# Patient Record
Sex: Female | Born: 1945 | Race: White | Hispanic: No | Marital: Married | Smoking: Former smoker
Health system: Southern US, Community
[De-identification: ages and names within clinical notes are randomized; demographics above are authoritative.]

## PROBLEM LIST (undated history)

## (undated) DIAGNOSIS — M1990 Unspecified osteoarthritis, unspecified site: Secondary | ICD-10-CM

## (undated) DIAGNOSIS — R519 Headache, unspecified: Secondary | ICD-10-CM

## (undated) DIAGNOSIS — Z809 Family history of malignant neoplasm, unspecified: Secondary | ICD-10-CM

## (undated) DIAGNOSIS — I10 Essential (primary) hypertension: Secondary | ICD-10-CM

## (undated) DIAGNOSIS — Z800 Family history of malignant neoplasm of digestive organs: Secondary | ICD-10-CM

## (undated) DIAGNOSIS — J45909 Unspecified asthma, uncomplicated: Secondary | ICD-10-CM

## (undated) DIAGNOSIS — N938 Other specified abnormal uterine and vaginal bleeding: Secondary | ICD-10-CM

## (undated) DIAGNOSIS — Z8049 Family history of malignant neoplasm of other genital organs: Secondary | ICD-10-CM

## (undated) DIAGNOSIS — K219 Gastro-esophageal reflux disease without esophagitis: Secondary | ICD-10-CM

## (undated) DIAGNOSIS — Z8041 Family history of malignant neoplasm of ovary: Secondary | ICD-10-CM

## (undated) DIAGNOSIS — M8580 Other specified disorders of bone density and structure, unspecified site: Secondary | ICD-10-CM

## (undated) DIAGNOSIS — E079 Disorder of thyroid, unspecified: Secondary | ICD-10-CM

## (undated) DIAGNOSIS — Z8489 Family history of other specified conditions: Secondary | ICD-10-CM

## (undated) DIAGNOSIS — D6800 Von Willebrand disease, unspecified: Secondary | ICD-10-CM

## (undated) DIAGNOSIS — Z8042 Family history of malignant neoplasm of prostate: Secondary | ICD-10-CM

## (undated) DIAGNOSIS — R112 Nausea with vomiting, unspecified: Secondary | ICD-10-CM

## (undated) DIAGNOSIS — E7800 Pure hypercholesterolemia, unspecified: Secondary | ICD-10-CM

## (undated) DIAGNOSIS — D219 Benign neoplasm of connective and other soft tissue, unspecified: Secondary | ICD-10-CM

## (undated) DIAGNOSIS — Z9889 Other specified postprocedural states: Secondary | ICD-10-CM

## (undated) HISTORY — DX: Other specified disorders of bone density and structure, unspecified site: M85.80

## (undated) HISTORY — DX: Disorder of thyroid, unspecified: E07.9

## (undated) HISTORY — DX: Family history of malignant neoplasm, unspecified: Z80.9

## (undated) HISTORY — PX: TUBAL LIGATION: SHX77

## (undated) HISTORY — DX: Essential (primary) hypertension: I10

## (undated) HISTORY — DX: Pure hypercholesterolemia, unspecified: E78.00

## (undated) HISTORY — DX: Family history of malignant neoplasm of ovary: Z80.41

## (undated) HISTORY — DX: Benign neoplasm of connective and other soft tissue, unspecified: D21.9

## (undated) HISTORY — DX: Family history of malignant neoplasm of prostate: Z80.42

## (undated) HISTORY — DX: Other specified abnormal uterine and vaginal bleeding: N93.8

## (undated) HISTORY — DX: Family history of malignant neoplasm of other genital organs: Z80.49

## (undated) HISTORY — DX: Family history of malignant neoplasm of digestive organs: Z80.0

## (undated) HISTORY — PX: OTHER SURGICAL HISTORY: SHX169

## (undated) HISTORY — DX: Gastro-esophageal reflux disease without esophagitis: K21.9

## (undated) HISTORY — PX: MOUTH SURGERY: SHX715

---

## 1996-09-04 HISTORY — PX: OOPHORECTOMY: SHX86

## 1996-09-04 HISTORY — PX: VAGINAL HYSTERECTOMY: SUR661

## 1999-03-26 ENCOUNTER — Ambulatory Visit (HOSPITAL_COMMUNITY): Admission: RE | Admit: 1999-03-26 | Discharge: 1999-03-26 | Payer: Self-pay | Admitting: Internal Medicine

## 1999-03-26 ENCOUNTER — Encounter: Payer: Self-pay | Admitting: Internal Medicine

## 1999-04-06 ENCOUNTER — Encounter: Admission: RE | Admit: 1999-04-06 | Discharge: 1999-05-20 | Payer: Self-pay | Admitting: Internal Medicine

## 1999-06-14 ENCOUNTER — Other Ambulatory Visit: Admission: RE | Admit: 1999-06-14 | Discharge: 1999-06-14 | Payer: Self-pay | Admitting: Obstetrics and Gynecology

## 2000-08-03 ENCOUNTER — Other Ambulatory Visit: Admission: RE | Admit: 2000-08-03 | Discharge: 2000-08-03 | Payer: Self-pay | Admitting: Obstetrics and Gynecology

## 2001-09-09 ENCOUNTER — Other Ambulatory Visit: Admission: RE | Admit: 2001-09-09 | Discharge: 2001-09-09 | Payer: Self-pay | Admitting: Obstetrics and Gynecology

## 2003-01-14 ENCOUNTER — Other Ambulatory Visit: Admission: RE | Admit: 2003-01-14 | Discharge: 2003-01-14 | Payer: Self-pay | Admitting: Obstetrics and Gynecology

## 2003-10-26 ENCOUNTER — Other Ambulatory Visit: Admission: RE | Admit: 2003-10-26 | Discharge: 2003-10-26 | Payer: Self-pay | Admitting: Obstetrics and Gynecology

## 2003-12-28 ENCOUNTER — Ambulatory Visit (HOSPITAL_COMMUNITY): Admission: RE | Admit: 2003-12-28 | Discharge: 2003-12-28 | Payer: Self-pay | Admitting: Internal Medicine

## 2004-11-08 ENCOUNTER — Encounter: Admission: RE | Admit: 2004-11-08 | Discharge: 2004-11-08 | Payer: Self-pay | Admitting: Internal Medicine

## 2004-11-21 ENCOUNTER — Other Ambulatory Visit: Admission: RE | Admit: 2004-11-21 | Discharge: 2004-11-21 | Payer: Self-pay | Admitting: Addiction Medicine

## 2005-01-13 ENCOUNTER — Emergency Department (HOSPITAL_COMMUNITY): Admission: EM | Admit: 2005-01-13 | Discharge: 2005-01-13 | Payer: Self-pay | Admitting: Emergency Medicine

## 2005-08-15 ENCOUNTER — Ambulatory Visit (HOSPITAL_COMMUNITY): Admission: RE | Admit: 2005-08-15 | Discharge: 2005-08-15 | Payer: Self-pay | Admitting: Endocrinology

## 2005-09-07 ENCOUNTER — Encounter: Admission: RE | Admit: 2005-09-07 | Discharge: 2005-09-07 | Payer: Self-pay | Admitting: Gastroenterology

## 2005-11-22 ENCOUNTER — Other Ambulatory Visit: Admission: RE | Admit: 2005-11-22 | Discharge: 2005-11-22 | Payer: Self-pay | Admitting: Obstetrics and Gynecology

## 2006-12-05 ENCOUNTER — Other Ambulatory Visit: Admission: RE | Admit: 2006-12-05 | Discharge: 2006-12-05 | Payer: Self-pay | Admitting: Obstetrics and Gynecology

## 2007-07-02 ENCOUNTER — Emergency Department (HOSPITAL_COMMUNITY): Admission: EM | Admit: 2007-07-02 | Discharge: 2007-07-02 | Payer: Self-pay | Admitting: Emergency Medicine

## 2007-09-19 ENCOUNTER — Encounter: Admission: RE | Admit: 2007-09-19 | Discharge: 2007-09-19 | Payer: Self-pay | Admitting: Internal Medicine

## 2007-09-20 ENCOUNTER — Encounter: Admission: RE | Admit: 2007-09-20 | Discharge: 2007-09-20 | Payer: Self-pay | Admitting: Internal Medicine

## 2008-08-05 ENCOUNTER — Ambulatory Visit: Payer: Self-pay | Admitting: Obstetrics and Gynecology

## 2008-08-05 ENCOUNTER — Other Ambulatory Visit: Admission: RE | Admit: 2008-08-05 | Discharge: 2008-08-05 | Payer: Self-pay | Admitting: Obstetrics and Gynecology

## 2008-08-05 ENCOUNTER — Encounter: Payer: Self-pay | Admitting: Obstetrics and Gynecology

## 2008-08-07 ENCOUNTER — Ambulatory Visit: Payer: Self-pay | Admitting: Obstetrics and Gynecology

## 2009-10-01 ENCOUNTER — Other Ambulatory Visit: Admission: RE | Admit: 2009-10-01 | Discharge: 2009-10-01 | Payer: Self-pay | Admitting: Obstetrics and Gynecology

## 2009-10-01 ENCOUNTER — Ambulatory Visit: Payer: Self-pay | Admitting: Obstetrics and Gynecology

## 2009-10-07 ENCOUNTER — Ambulatory Visit: Payer: Self-pay | Admitting: Obstetrics and Gynecology

## 2009-12-19 ENCOUNTER — Ambulatory Visit: Payer: Self-pay | Admitting: Cardiovascular Disease

## 2009-12-19 ENCOUNTER — Observation Stay (HOSPITAL_COMMUNITY): Admission: EM | Admit: 2009-12-19 | Discharge: 2009-12-20 | Payer: Self-pay | Admitting: Emergency Medicine

## 2010-09-25 ENCOUNTER — Encounter: Payer: Self-pay | Admitting: Gastroenterology

## 2010-11-16 ENCOUNTER — Encounter: Payer: Self-pay | Admitting: Obstetrics and Gynecology

## 2010-11-22 LAB — BASIC METABOLIC PANEL
BUN: 13 mg/dL (ref 6–23)
Calcium: 9.7 mg/dL (ref 8.4–10.5)
GFR calc non Af Amer: >60 mL/min (ref >60)
Glucose, Bld: 110 mg/dL — ABNORMAL HIGH (ref 70–99)
Potassium: 3.9 mEq/L (ref 3.5–5.1)

## 2010-11-22 LAB — POCT CARDIAC MARKERS
CKMB, poc: <1 ng/mL — ABNORMAL LOW (ref 1.0–8.0)
Myoglobin, poc: 33.1 ng/mL (ref 12–200)
Myoglobin, poc: 37.2 ng/mL (ref 12–200)
Troponin i, poc: <0.05 ng/mL (ref 0.00–0.09)

## 2010-11-22 LAB — CBC
HCT: 38.9 % (ref 36.0–46.0)
Platelets: 155 10*3/uL (ref 150–400)
RDW: 12.4 % (ref 11.5–15.5)

## 2010-11-22 LAB — DIFFERENTIAL
Basophils Absolute: 0 10*3/uL (ref 0.0–0.1)
Basophils Relative: 0 % (ref 0–1)
Eosinophils Absolute: 0.1 10*3/uL (ref 0.0–0.7)
Eosinophils Relative: 2 % (ref 0–5)
Lymphocytes Relative: 43 % (ref 12–46)

## 2010-11-22 LAB — CK TOTAL AND CKMB (NOT AT ARMC): Relative Index: INVALID (ref 0.0–2.5)

## 2010-11-22 LAB — POCT I-STAT, CHEM 8
BUN: 15 mg/dL (ref 6–23)
HCT: 41 % (ref 36.0–46.0)
Hemoglobin: 13.9 g/dL (ref 12.0–15.0)
Sodium: 140 mEq/L (ref 135–145)
TCO2: 29 mmol/L (ref 0–100)

## 2010-11-29 ENCOUNTER — Encounter: Payer: Self-pay | Admitting: Obstetrics and Gynecology

## 2010-12-07 ENCOUNTER — Encounter (INDEPENDENT_AMBULATORY_CARE_PROVIDER_SITE_OTHER): Payer: BC Managed Care – PPO | Admitting: Obstetrics and Gynecology

## 2010-12-07 ENCOUNTER — Other Ambulatory Visit: Payer: Self-pay | Admitting: Obstetrics and Gynecology

## 2010-12-07 ENCOUNTER — Other Ambulatory Visit (HOSPITAL_COMMUNITY)
Admission: RE | Admit: 2010-12-07 | Discharge: 2010-12-07 | Disposition: A | Discharge status: Home / Self Care | Payer: BC Managed Care – PPO | Source: Ambulatory Visit | Attending: Obstetrics and Gynecology | Admitting: Obstetrics and Gynecology

## 2010-12-07 DIAGNOSIS — Z124 Encounter for screening for malignant neoplasm of cervix: Secondary | ICD-10-CM | POA: Insufficient documentation

## 2010-12-07 DIAGNOSIS — Z01419 Encounter for gynecological examination (general) (routine) without abnormal findings: Secondary | ICD-10-CM

## 2010-12-15 ENCOUNTER — Other Ambulatory Visit: Payer: BC Managed Care – PPO

## 2010-12-15 ENCOUNTER — Ambulatory Visit (INDEPENDENT_AMBULATORY_CARE_PROVIDER_SITE_OTHER): Payer: BC Managed Care – PPO | Admitting: Obstetrics and Gynecology

## 2010-12-15 DIAGNOSIS — Z803 Family history of malignant neoplasm of breast: Secondary | ICD-10-CM

## 2010-12-15 DIAGNOSIS — Z8041 Family history of malignant neoplasm of ovary: Secondary | ICD-10-CM

## 2010-12-15 DIAGNOSIS — N731 Chronic parametritis and pelvic cellulitis: Secondary | ICD-10-CM

## 2011-04-25 ENCOUNTER — Encounter: Payer: Self-pay | Admitting: Obstetrics and Gynecology

## 2011-12-06 ENCOUNTER — Encounter: Payer: Self-pay | Admitting: Gynecology

## 2011-12-06 DIAGNOSIS — D219 Benign neoplasm of connective and other soft tissue, unspecified: Secondary | ICD-10-CM | POA: Insufficient documentation

## 2011-12-06 DIAGNOSIS — I10 Essential (primary) hypertension: Secondary | ICD-10-CM | POA: Insufficient documentation

## 2011-12-06 DIAGNOSIS — E079 Disorder of thyroid, unspecified: Secondary | ICD-10-CM | POA: Insufficient documentation

## 2011-12-06 DIAGNOSIS — N938 Other specified abnormal uterine and vaginal bleeding: Secondary | ICD-10-CM | POA: Insufficient documentation

## 2011-12-06 DIAGNOSIS — K219 Gastro-esophageal reflux disease without esophagitis: Secondary | ICD-10-CM | POA: Insufficient documentation

## 2011-12-06 DIAGNOSIS — M8580 Other specified disorders of bone density and structure, unspecified site: Secondary | ICD-10-CM | POA: Insufficient documentation

## 2011-12-13 ENCOUNTER — Encounter: Payer: BC Managed Care – PPO | Admitting: Obstetrics and Gynecology

## 2012-01-25 ENCOUNTER — Ambulatory Visit (INDEPENDENT_AMBULATORY_CARE_PROVIDER_SITE_OTHER): Payer: BC Managed Care – PPO | Admitting: Obstetrics and Gynecology

## 2012-01-25 ENCOUNTER — Encounter: Payer: Self-pay | Admitting: Obstetrics and Gynecology

## 2012-01-25 VITALS — BP 130/82 | Ht 64.0 in | Wt 154.0 lb

## 2012-01-25 DIAGNOSIS — N949 Unspecified condition associated with female genital organs and menstrual cycle: Secondary | ICD-10-CM

## 2012-01-25 DIAGNOSIS — E7800 Pure hypercholesterolemia, unspecified: Secondary | ICD-10-CM | POA: Insufficient documentation

## 2012-01-25 DIAGNOSIS — Z01419 Encounter for gynecological examination (general) (routine) without abnormal findings: Secondary | ICD-10-CM

## 2012-01-25 DIAGNOSIS — R102 Pelvic and perineal pain: Secondary | ICD-10-CM

## 2012-01-25 NOTE — Progress Notes (Signed)
Patient came to see me today for her annual GYN exam. She is doing well without hormone replacement therapy. She does have vaginal dryness and is using lubricant. She is having no vaginal bleeding. She does have some lower abdominal discomfort. Her mother had been tested for BRCA1 and 2 and had a gene of uncertain significance rather than a deleterious gene. Her mother had both ovarian and colon cancer. Her father and 2 brothers have had prostate cancer. The patient still has one ovary. She is being treated for hyperthyroidism but needs a new endocrinologists. She is up-to-date on colonoscopies. She is up-to-date on mammograms. She does her lab through her PCP. She does bone densities through her PCP. Patient has osteopenia without an elevated FRAX risk.  HEENT: Within normal limits. Kennon Portela present. Neck: No masses. Supraclavicular lymph nodes: Not enlarged. Breasts: Examined in both sitting and lying position. Symmetrical without skin changes or masses. Abdomen: Soft no masses guarding or rebound. No hernias. Pelvic: External within normal limits. BUS within normal limits. Vaginal examination shows good estrogen effect, no cystocele enterocele or rectocele. Cervix and uterus absent. Adnexa within normal limits. Rectovaginal confirmatory. Extremities within normal limits.  Assessment: #1. Lower abdominal discomfort #2. Atrophic vaginitis #3. Strong family history of cancer #4. Osteopenia #5. Hyperthyroidism  Plan: Pelvic ultrasound. Estradiol vaginal cream 0.02% use vaginally 3 times a week. Suggested she go to the cancer center for genetic testing counseling. Continue yearly  Mammograms. Bone densities through PCP.

## 2012-02-14 ENCOUNTER — Ambulatory Visit (INDEPENDENT_AMBULATORY_CARE_PROVIDER_SITE_OTHER): Payer: BC Managed Care – PPO | Admitting: Obstetrics and Gynecology

## 2012-02-14 ENCOUNTER — Ambulatory Visit (INDEPENDENT_AMBULATORY_CARE_PROVIDER_SITE_OTHER): Payer: BC Managed Care – PPO

## 2012-02-14 DIAGNOSIS — R102 Pelvic and perineal pain: Secondary | ICD-10-CM

## 2012-02-14 DIAGNOSIS — N949 Unspecified condition associated with female genital organs and menstrual cycle: Secondary | ICD-10-CM

## 2012-02-14 DIAGNOSIS — N83339 Acquired atrophy of ovary and fallopian tube, unspecified side: Secondary | ICD-10-CM

## 2012-02-14 NOTE — Progress Notes (Signed)
Patient came back for ultrasound today because of pelvic pain. On ultrasound her uterus is surgically absent. Her right adnexa had been surgically removed and there were no masses. Her left ovary appears atrophic without masses. There is increased bowel activity. Her cul-de-sac is free of fluid.  Assessment: Pelvic pain with normal ultrasound  Plan: Patient reassured that there was no pathology on ultrasound. I suggest she discussed her pain with her gastroenterologist. We discussed aerobe bowel syndrome.

## 2012-05-02 ENCOUNTER — Encounter: Payer: Self-pay | Admitting: Obstetrics and Gynecology

## 2012-06-18 ENCOUNTER — Other Ambulatory Visit: Payer: Self-pay | Admitting: Internal Medicine

## 2012-06-18 DIAGNOSIS — R109 Unspecified abdominal pain: Secondary | ICD-10-CM

## 2012-06-19 ENCOUNTER — Ambulatory Visit
Admission: RE | Admit: 2012-06-19 | Discharge: 2012-06-19 | Disposition: A | Discharge status: Home / Self Care | Payer: BC Managed Care – PPO | Source: Ambulatory Visit | Attending: Internal Medicine | Admitting: Internal Medicine

## 2012-06-19 DIAGNOSIS — R109 Unspecified abdominal pain: Secondary | ICD-10-CM

## 2012-06-19 MED ORDER — IOHEXOL 300 MG/ML  SOLN
100.0000 mL | Freq: Once | INTRAMUSCULAR | Status: AC | PRN
  Administered 2012-06-19: 100 mL via INTRAVENOUS

## 2014-01-06 ENCOUNTER — Telehealth: Payer: Self-pay | Admitting: Genetic Counselor

## 2014-01-06 ENCOUNTER — Encounter: Payer: Self-pay | Admitting: Gynecology

## 2014-01-06 ENCOUNTER — Ambulatory Visit (INDEPENDENT_AMBULATORY_CARE_PROVIDER_SITE_OTHER): Payer: BC Managed Care – PPO | Admitting: Gynecology

## 2014-01-06 VITALS — BP 124/70 | HR 64 | Resp 18 | Ht 64.0 in | Wt 160.0 lb

## 2014-01-06 DIAGNOSIS — Z808 Family history of malignant neoplasm of other organs or systems: Secondary | ICD-10-CM

## 2014-01-06 DIAGNOSIS — Z01419 Encounter for gynecological examination (general) (routine) without abnormal findings: Secondary | ICD-10-CM

## 2014-01-06 DIAGNOSIS — Z0000 Encounter for general adult medical examination without abnormal findings: Secondary | ICD-10-CM

## 2014-01-06 DIAGNOSIS — Z8041 Family history of malignant neoplasm of ovary: Secondary | ICD-10-CM

## 2014-01-06 DIAGNOSIS — Z800 Family history of malignant neoplasm of digestive organs: Secondary | ICD-10-CM

## 2014-01-06 NOTE — Progress Notes (Signed)
68 y.o. Married Caucasian female   [phone] here for annual exam.  She rarely report hot flashes, does not have night sweats, does have vaginal dryness.  She is using lubricants, OTC.  She does not report post-menopasual bleeding.  Pt states she had a RSO TVH due to fibroids but she has since learned that her mother had a BRCA-2 variant.  Her mother had stage IV colon and stage III ovarian 14y ago.  2 primaries.  Pt is interested in removal of left adnexa.  Pt has issues with spontaneous bleeding from ASA, NSAIDS and ginger-evaluation for VWF negative years ago. Pt has never been screened for mutation.   Pt does not get regular dermatology vists despite strong family history of both melanoma and basal   No LMP recorded. Patient has had a hysterectomy.          Sexually active: yes  The current method of family planning is post menopausal status.    Exercising: yes  Walking  Last pap: 2013 - Normal Abnormal PAP: No Mammogram: 02/2013 - Normal BSE: No Colonoscopy: 2009 DEXA: 02/2013 - Osteopenia  Alcohol: Yes, 5 glasses of wine a week Tobacco: No  Health Maintenance  Topic Date Due  . Tetanus/tdap  10/09/1965  . Colonoscopy  10/09/1996  . Zostavax  10/09/2006  . Pneumococcal Polysaccharide Vaccine Age 25 And Over  10/10/2011  . Mammogram  04/24/2013  . Influenza Vaccine  04/04/2014    Family History  Problem Relation Age of Onset  . Heart disease Mother   . Hypertension Mother   . Ovarian cancer Mother   . Colon cancer Mother   . Cancer - Cervical Mother   . Skin cancer Mother   . Heart disease Brother   . Cancer Brother     Prostate cancer  . Melanoma Brother   . Breast cancer Maternal Aunt     Age 52's  . Breast cancer Maternal Aunt     Age 83's  . Cancer Brother     Prostate cancer  . Leukemia Father   . Prostate cancer Father   . Skin cancer Father     Patient Active Problem List   Diagnosis Date Noted  . Elevated cholesterol   . Fibroid   . Osteopenia   . DUB  (dysfunctional uterine bleeding)   . Thyroid disease   . GERD (gastroesophageal reflux disease)   . Hypertension     Past Medical History  Diagnosis Date  . Fibroid   . Osteopenia   . DUB (dysfunctional uterine bleeding)   . Thyroid disease     Hyperthyroid  . GERD (gastroesophageal reflux disease)   . Hypertension   . Elevated cholesterol     Past Surgical History  Procedure Laterality Date  . Tubal ligation    . Vaginal hysterectomy  1998  . Oophorectomy      RSO    Allergies: Aspirin; Ibuprofen; and Penicillins  Current Outpatient Prescriptions  Medication Sig Dispense Refill  . atorvastatin (LIPITOR) 10 MG tablet Take 5 mg by mouth daily.      . Calcium Carbonate-Vitamin D (CALCIUM + D PO) Take by mouth.      Marland Kitchen HYDROCHLOROTHIAZIDE PO Take 12.5 mg by mouth daily.       . Methimazole (TAPAZOLE PO) Take 5 mg by mouth daily.       . Multiple Vitamin (MULTIVITAMIN) tablet Take 1 tablet by mouth daily.      . Ranitidine HCl (ZANTAC PO) Take by mouth  2 (two) times daily.        No current facility-administered medications for this visit.    ROS: Pertinent items are noted in HPI.  Exam:    BP 124/70  Pulse 64  Resp 18  Ht _0  (1.626 m)  Wt 160 lb (72.576 kg)  BMI 27.45 kg/m2 Weight change: _1 @ Last 3 height recordings:  Ht Readings from Last 3 Encounters:  01/06/14 _2  (1.626 m)  01/25/12 _3  (1.626 m)   General appearance: alert, cooperative and appears stated age Head: Normocephalic, without obvious abnormality, atraumatic Neck: no adenopathy, no carotid bruit, no JVD, supple, symmetrical, trachea midline and thyroid not enlarged, symmetric, no tenderness/mass/nodules Lungs: clear to auscultation bilaterally Breasts: normal appearance, no masses or tenderness Heart: regular rate and rhythm, S1, S2 normal, no murmur, click, rub or gallop Abdomen: soft, non-tender; bowel sounds normal; no masses,  no organomegaly Extremities: extremities  normal, atraumatic, no cyanosis or edema Skin: Skin color, texture, turgor normal. No rashes or lesions Lymph nodes: Cervical, supraclavicular, and axillary nodes normal. no inguinal nodes palpated Neurologic: Grossly normal   Pelvic: External genitalia:  no lesions              Urethra: normal appearing urethra with no masses, tenderness or lesions              Bartholins and Skenes: Bartholin's, Urethra, Skene's normal                 Vagina: normal appearing vagina with normal color and discharge, no lesions              Cervix: absent              Pap taken: no        Bimanual Exam:  Uterus:  absent                                      Adnexa:    no masses                                      Rectovaginal: Confirms                                      Anus:  normal sphincter tone, no lesions  A: well woman Family history of colon and ovarian cancer variant Bleeding dystasia      P: we discussed at length the importance of having genetic testing of mother's variant to assess pt's and her children's risks, we reviewed the recommended surveillance if the results are positive such as breast MRI and 3d mammograms, consider removal of left adnexa however is she is negative, she should feel comfortable keeping her ovary. Pt comfortable with plan, we will refer to genetics and she will try to get her mother's mutation record. Recommend pt see dermatology due to strong family history of skin cancer-referral made Importance of routine mammogram and BSE disucssed  importance of weight bearing exercises, calcium, vit D and balanced diet. Bleeding stable off medication  An After Visit Summary was printed and given to the patient.

## 2014-01-06 NOTE — Telephone Encounter (Signed)
S/W PATIENT AND GAVE GENETIC APPT FOR 06/10 @ 1:30 W/GENETIC COUNSELOR

## 2014-01-07 ENCOUNTER — Telehealth: Payer: Self-pay | Admitting: Gynecology

## 2014-01-07 NOTE — Telephone Encounter (Signed)
Spoke with patient. Advised that per benefit quote received, she will be responsible for $408.26 when she comes in for PUS. Scheduled PUS. Patient agreeable. Advised patient of 72 hour cancellation policy and $893 cancellation fee. Patient agreeable.  Mailed the In-Office procedure form that includes appointment date and time, patient copay, and cancellation policy.

## 2014-01-15 NOTE — Telephone Encounter (Signed)
Pt wants to talk with the nurse she would like to reschedule her 02/03/14 ultrasound. She will be having genetic testing done @  on 6/10115 and thinks maybe she should wait on the ultrasound so she would have the results then.

## 2014-01-16 NOTE — Telephone Encounter (Signed)
Left message to call Daisa Stennis at [phone]. 

## 2014-01-19 NOTE — Telephone Encounter (Signed)
Spoke with patient. Patient would like to wait to have PUS until she can get results back from genetics testing appointment. Okay per Dr.Lathrop for patient to wait. PUS rescheduled for 6/30 at 2:00pm with consult at 2:30pm with Dr.Lathrop. Patient agreeable to date and time. Patient verbalized understanding of the [facility] appointment cancellation policy. Advised will need to cancel within 72 business hours (3 business days) or will have $100.00 no show fee placed to account.  Routing to provider for final review. Patient agreeable to disposition. Will close encounter

## 2014-01-19 NOTE — Telephone Encounter (Signed)
Left message to call Kaitlyn at [phone]. 

## 2014-01-19 NOTE — Telephone Encounter (Signed)
Patient returning Kaitlyn's call. °

## 2014-02-03 ENCOUNTER — Other Ambulatory Visit: Payer: BC Managed Care – PPO | Admitting: Gynecology

## 2014-02-03 ENCOUNTER — Other Ambulatory Visit: Payer: BC Managed Care – PPO

## 2014-02-11 ENCOUNTER — Other Ambulatory Visit: Payer: BC Managed Care – PPO

## 2014-02-11 ENCOUNTER — Ambulatory Visit (HOSPITAL_BASED_OUTPATIENT_CLINIC_OR_DEPARTMENT_OTHER): Payer: BC Managed Care – PPO | Admitting: Genetic Counselor

## 2014-02-11 ENCOUNTER — Encounter: Payer: Self-pay | Admitting: Genetic Counselor

## 2014-02-11 DIAGNOSIS — IMO0002 Reserved for concepts with insufficient information to code with codable children: Secondary | ICD-10-CM

## 2014-02-11 DIAGNOSIS — Z809 Family history of malignant neoplasm, unspecified: Secondary | ICD-10-CM

## 2014-02-11 NOTE — Progress Notes (Signed)
Patient Name: Megan Huang [facility] Patient Age: 68 y.o. Encounter Date: 02/11/2014  Referring Physician: Elveria Rising, MD  Primary Care Provider: Irven Shelling, MD   [name], a 68 y.o. female, is being seen at the [facility] due to a family history of multiple cancers.  She presents to clinic today to discuss the possibility of a hereditary predisposition to cancer and discuss whether genetic testing is warranted.  HISTORY OF PRESENT ILLNESS: [name] has no personal history of cancer. She has a yearly gynecologic exam, clinical breast exam and mammogram. She had a hysterectomy at age 42 due to fibroids and one ovary remains intact. She stated that she has a goiter and hyperthyroidism.  Past Medical History  Diagnosis Date  . Fibroid   . Osteopenia   . DUB (dysfunctional uterine bleeding)   . Thyroid disease     Hyperthyroid  . GERD (gastroesophageal reflux disease)   . Hypertension   . Elevated cholesterol   . Family history of cancer     Past Surgical History  Procedure Laterality Date  . Tubal ligation    . Oophorectomy  1998    RSO  . Vaginal hysterectomy  1998    one ovary intact    History   Social History  . Marital Status: Married    Spouse Name: N/A    Number of Children: N/A  . Years of Education: N/A   Social History Main Topics  . Smoking status: Former Research scientist (life sciences)  . Smokeless tobacco: Never Used  . Alcohol Use: 3.0 oz/week    5 Glasses of wine per week  . Drug Use: No  . Sexual Activity: Yes    Birth Control/ Protection: Surgical   Other Topics Concern  . Not on file   Social History Narrative  . No narrative on file     FAMILY HISTORY:   During the visit, a 4-generation pedigree was obtained. Significant diagnoses include the following:  Mother: currently 53; ovarian cancer and colon cancer at 79; squamous and basal cell skin cancers; thyroidectomy due to a benign mass in her 7s; partial hysterectomy in her 40s due to  bleeding. Sister: currently 53; squamous skin cancer in her 12s Brother: currently 55; prostate cancer at 37; squamous and basal cell and melanoma Brother: currently 45; prostate cancer at 11; non-melanoma skin cancer Maternal aunt: deceased 23; breast cancer at 51 Maternal aunt; deceased at 16; cancer-free Daughter of this unaffected aunt: currently 72; cancer (unk. primary) at 59 Maternal grandmother: deceased 59; cancer-free. This grandmother had 2 brothers with prostate cancer, one brother with lung cancer (smoker), one sister with colon cancer at 30 and 2 sisters with unknown cancers in 2s and 38s.  Additionally, [name]'s two daughters (ages 37 and 29) both have a history of thyroid problems such as goiter and hypoparathyroidism. Neither has had cancer.  [name] ancestry is [place], [place] and [place]. There is no known Jewish ancestry and no consanguinity.  ASSESSMENT AND PLAN: [name] is a 68 y.o. female with a family history of colon and ovarian cancers in her mother at later ages of onset 6230) and a family history of other cancers. While there are many relatives with cancer, this history is not highly suggestive of a hereditary predisposition to cancer. Her mother had undergone BRCA1/BRCA2 genetic testing in 2008 through [employer]. No pathogenic mutations were identified. A BRCA2 variant of unknown significance (VUS) was detected which is of no relevance to [name] at  the moment. At the time her mother was tested, [facility]'s analysis did not include deletion/duplication testing. We discussed that if her mother wished to have additional testing, we could repeat the BRCA testing. We also discussed Lynch syndrome due to her mother's colon and uterine cancers. We reviewed the hallmark features of Lynch syndrome and that her mother has a very low likelihood of having Lynch syndrome due to the ages of onset of her cancers and the family history. However, should her mother want testing  for Lynch syndrome, we can coordinate this as she meets Medicare's criteria for testing. We explained that there is a higher likelihood of finding a VUS than of finding a pathogenic mutation.  [name] explained that many of her relatives grew up in [place] state in the [place] near a nuclear power plant where there was an accidental discharge. We discussed the influence of radiation and other environmental factors on cancer risk and health in general.  Genetic testing was not recommend at this time for [name]. Should her mother wish to pursue additional testing, we would be happy to see her. We recommended [name] continue to have a yearly mammogram, a yearly clinical breast exam, perform monthly breast self-exams and have a yearly gynecologic exam. She stated that her colonoscopy 8 years ago was negative for polyps and that she will have another screening in 2 years.   We encouraged [name] to remain in contact with [place] annually so that we can update the family history and inform her of any changes in cancer genetics and testing that may be of benefit for this family. Ms.  Megan Huang were answered to her satisfaction today.   Thank you for the referral and allowing [place] to share in the care of your patient.   The patient was seen for a total of 30 minutes, greater than 50% of which was spent face-to-face counseling. This patient was discussed with the overseeing provider who agrees with the above.

## 2014-02-24 ENCOUNTER — Telehealth: Payer: Self-pay | Admitting: Gynecology

## 2014-02-24 NOTE — Telephone Encounter (Signed)
Patient called and cancelled her appointment for 03/03/14 for a ultrasound. She will be out of town that day. She will call back to reschedule when she gets back in town.

## 2014-03-03 ENCOUNTER — Other Ambulatory Visit: Payer: BC Managed Care – PPO

## 2014-03-03 ENCOUNTER — Other Ambulatory Visit: Payer: BC Managed Care – PPO | Admitting: Gynecology

## 2014-06-19 ENCOUNTER — Other Ambulatory Visit: Payer: Self-pay

## 2014-07-06 ENCOUNTER — Encounter: Payer: Self-pay | Admitting: Genetic Counselor

## 2015-03-01 ENCOUNTER — Other Ambulatory Visit: Payer: Self-pay

## 2015-03-24 ENCOUNTER — Ambulatory Visit: Payer: BLUE CROSS/BLUE SHIELD | Admitting: Obstetrics and Gynecology

## 2015-05-06 ENCOUNTER — Ambulatory Visit (INDEPENDENT_AMBULATORY_CARE_PROVIDER_SITE_OTHER): Payer: BLUE CROSS/BLUE SHIELD | Admitting: Obstetrics and Gynecology

## 2015-05-06 ENCOUNTER — Encounter: Payer: Self-pay | Admitting: Obstetrics and Gynecology

## 2015-05-06 VITALS — BP 124/70 | HR 72 | Resp 16 | Ht 63.5 in | Wt 155.0 lb

## 2015-05-06 DIAGNOSIS — Z8049 Family history of malignant neoplasm of other genital organs: Secondary | ICD-10-CM | POA: Diagnosis not present

## 2015-05-06 DIAGNOSIS — Z800 Family history of malignant neoplasm of digestive organs: Secondary | ICD-10-CM

## 2015-05-06 DIAGNOSIS — Z808 Family history of malignant neoplasm of other organs or systems: Secondary | ICD-10-CM | POA: Diagnosis not present

## 2015-05-06 DIAGNOSIS — Z0000 Encounter for general adult medical examination without abnormal findings: Secondary | ICD-10-CM

## 2015-05-06 DIAGNOSIS — Z8041 Family history of malignant neoplasm of ovary: Secondary | ICD-10-CM | POA: Diagnosis not present

## 2015-05-06 DIAGNOSIS — Z01419 Encounter for gynecological examination (general) (routine) without abnormal findings: Secondary | ICD-10-CM | POA: Diagnosis not present

## 2015-05-06 LAB — POC HEMOCCULT BLD/STL (OFFICE/1-CARD/DIAGNOSTIC): FECAL OCCULT BLD: NEGATIVE

## 2015-05-06 NOTE — Patient Instructions (Signed)
Talk with your primary about colonoscopy  EXERCISE AND DIET:  We recommended that you start or continue a regular exercise program for good health. Regular exercise means any activity that makes your heart beat faster and makes you sweat.  We recommend exercising at least 30 minutes per day at least 3 days a week, preferably 4 or 5.  We also recommend a diet low in fat and sugar.  Inactivity, poor dietary choices and obesity can cause diabetes, heart attack, stroke, and kidney damage, among others.    ALCOHOL AND SMOKING:  Women should limit their alcohol intake to no more than 7 drinks/beers/glasses of wine (combined, not each!) per week. Moderation of alcohol intake to this level decreases your risk of breast cancer and liver damage. And of course, no recreational drugs are part of a healthy lifestyle.  And absolutely no smoking or even second hand smoke. Most people know smoking can cause heart and lung diseases, but did you know it also contributes to weakening of your bones? Aging of your skin?  Yellowing of your teeth and nails?  CALCIUM AND VITAMIN D:  Adequate intake of calcium and Vitamin D are recommended.  The recommendations for exact amounts of these supplements seem to change often, but generally speaking 600 mg of calcium (either carbonate or citrate) and 800 units of Vitamin D per day seems prudent. Certain women may benefit from higher intake of Vitamin D.  If you are among these women, your doctor will have told you during your visit.    PAP SMEARS:  Pap smears, to check for cervical cancer or precancers,  have traditionally been done yearly, although recent scientific advances have shown that most women can have pap smears less often.  However, every woman still should have a physical exam from her gynecologist every year. It will include a breast check, inspection of the vulva and vagina to check for abnormal growths or skin changes, a visual exam of the cervix, and then an exam to  evaluate the size and shape of the uterus and ovaries.  And after 68 years of age, a rectal exam is indicated to check for rectal cancers. We will also provide age appropriate advice regarding health maintenance, like when you should have certain vaccines, screening for sexually transmitted diseases, bone density testing, colonoscopy, mammograms, etc.   MAMMOGRAMS:  All women over 5 years old should have a yearly mammogram. Many facilities now offer a "3D" mammogram, which may cost around $50 extra out of pocket. If possible,  we recommend you accept the option to have the 3D mammogram performed.  It both reduces the number of women who will be called back for extra views which then turn out to be normal, and it is better than the routine mammogram at detecting truly abnormal areas.    COLONOSCOPY:  Colonoscopy to screen for colon cancer is recommended for all women at age 68.  We know, you hate the idea of the prep.  We agree, BUT, having colon cancer and not knowing it is worse!!  Colon cancer so often starts as a polyp that can be seen and removed at colonscopy, which can quite literally save your life!  And if your first colonoscopy is normal and you have no family history of colon cancer, most women don't have to have it again for 10 years.  Once every ten years, you can do something that may end up saving your life, right?  We will be happy to help you  get it scheduled when you are ready.  Be sure to check your insurance coverage so you understand how much it will cost.  It may be covered as a preventative service at no cost, but you should check your particular policy.

## 2015-05-06 NOTE — Progress Notes (Signed)
69 y.o. [id number] MarriedCaucasianF here for annual exam.  Pt had a RSO TVH due to fibroids but she has since learned that her mother had a BRCA-2 variant. Her mother had stage IV colon(70's) and stage III ovarian (70's), uterine cancer (40's) as well. 3 primaries, still living. The patient saw a geneticist, moms BRCA 2 variant is of unknown significance, no extra testing or surgery was recommended. No vaginal bleeding. Sexually active, okay with lubrication. She has minimal libido. She is okay with it.    Patient's last menstrual period was 09/04/1996.          Sexually active: Yes.    The current method of family planning is status post hysterectomy.    Exercising: Yes.    Walking Smoker:  former  Health Maintenance: Pap:  12/07/10 Neg History of abnormal Pap:  no MMG:  07/16/14 BIRADS2:Benign  Colonoscopy:  2009 - Normal - Due next years   BMD:   02/2013 Osteopenia  TDaP: by PCP Screening Labs: PCP, Hb today: PCP, Urine today: PCP   reports that she has quit smoking. She has never used smokeless tobacco. She reports that she drinks about 3.0 oz of alcohol per week. She reports that she does not use illicit drugs.  Past Medical History  Diagnosis Date  . Fibroid   . Osteopenia   . DUB (dysfunctional uterine bleeding)   . Thyroid disease     Hyperthyroid  . GERD (gastroesophageal reflux disease)   . Hypertension   . Elevated cholesterol   . Family history of cancer     Past Surgical History  Procedure Laterality Date  . Tubal ligation    . Oophorectomy  1998    RSO  . Vaginal hysterectomy  1998    one ovary intact    Current Outpatient Prescriptions  Medication Sig Dispense Refill  . atorvastatin (LIPITOR) 10 MG tablet Take 5 mg by mouth daily.    . Calcium Carbonate-Vitamin D (CALCIUM + D PO) Take by mouth.    . fluticasone (FLONASE) 50 MCG/ACT nasal spray as needed.  11  . hydrochlorothiazide (HYDRODIURIL) 25 MG tablet Take 25 mg by mouth daily.  11  . methimazole  (TAPAZOLE) 10 MG tablet Take 5 mg by mouth daily.  3  . Multiple Vitamin (MULTIVITAMIN) tablet Take 1 tablet by mouth daily.    . Ranitidine HCl (ZANTAC PO) Take by mouth 2 (two) times daily.      No current facility-administered medications for this visit.    Family History  Problem Relation Age of Onset  . Heart disease Mother   . Hypertension Mother   . Ovarian cancer Mother   . Colon cancer Mother   . Skin cancer Mother   . Osteoporosis Mother   . Heart disease Brother   . Cancer Brother     Prostate cancer  . Melanoma Brother   . Skin cancer Brother   . Breast cancer Maternal Aunt 80    deceased 25  . Cancer Brother     Prostate cancer  . Skin cancer Brother   . Leukemia Father   . Prostate cancer Father   . Skin cancer Father   . Skin cancer Sister    SH: 2 daughters, 3 grandchildren  ROS:  Pertinent items are noted in HPI.  Otherwise, a comprehensive ROS was negative.  Exam:   BP 124/70 mmHg  Pulse 72  Resp 16  Ht 5' 3.5" (1.613 m)  Wt 155 lb (70.308 kg)  BMI  27.02 kg/m2  LMP 09/04/1996  Weight change: @WEIGHTCHANGE @ Height:   Height: 5' 3.5" (161.3 cm)  Ht Readings from Last 3 Encounters:  05/06/15 5' 3.5" (1.613 m)  01/06/14 5' 4"  (1.626 m)  01/25/12 5' 4"  (1.626 m)    General appearance: alert, cooperative and appears stated age Head: Normocephalic, without obvious abnormality, atraumatic Neck: no adenopathy, supple, symmetrical, trachea midline and thyroid normal to inspection and palpation Lungs: clear to auscultation bilaterally Breasts: normal appearance, no masses or tenderness Heart: regular rate and rhythm Abdomen: soft, non-tender; bowel sounds normal; no masses,  no organomegaly Extremities: extremities normal, atraumatic, no cyanosis or edema Skin: Skin color, texture, turgor normal. No rashes or lesions Lymph nodes: Cervical, supraclavicular, and axillary nodes normal. No abnormal inguinal nodes palpated Neurologic: Grossly  normal   Pelvic: External genitalia:  no lesions              Urethra:  normal appearing urethra with no masses, tenderness or lesions              Bartholins and Skenes: normal                 Vagina: normal appearing vagina with normal color and discharge, no lesions              Cervix: absent              Pap taken: No. Bimanual Exam:  Uterus:  uterus absent              Adnexa: no mass, fullness, tenderness               Rectovaginal: Confirms               Anus:  normal sphincter tone, no lesions  Stool guiac negative  Chaperone was present for exam.  A:  Well Woman with normal exam  FH of uterine, ovarian and colon cancer, BRCA variant of unknow significance  Low libido  P:   No paps needed  Colonoscopy was normal, she was told she can wait 10 years, +FH, she will discuss the timing with her primary  Mammogram is UTD  Discussed breast self exam  Continue calcium and vit D   We discussed the options of checking a CA 125 and a pelvic ultrasound. We discussed the benefits, limitations and false positives. She understands it's not standard of care.  She would like to check a CA 125 and will consider pelvic ultrasound  Reading suggestions given for low libido  Labs with primary MD

## 2015-05-07 LAB — CA 125: CA 125: 9 U/mL (ref <35)

## 2015-09-08 NOTE — Progress Notes (Addendum)
Cardiology Office Note   Date:  09/09/2015   ID:  Megan Huang, DOB [birth year], MRN [id number]  PCP:  Irven Shelling, MD  Cardiologist:  New - seen by Dr. Burt Knack today  Chest pain    History of Present Illness: Megan Huang is a 70 y.o. female with a history of HTN, HLD, hyperthyroidism on Tapazole, von willebrand disease and no past cardiac history who presents to clinic for evaluation of chest pain.   She had a stress ECHO in 2011 during an admission for chest pain which returned low risk.  She was referred to [place] by her PCP, Dr. [name], for recent exertional angina. 4 days ago, she was walking up an incline while at her [place] and got diffuse chest pain like her chest was on fire. Two days later she was making cupcakes (moving lots of pots and pans) when she had midsternal chest pain that radiated to her throat. Both episodes lasted a couple minutes and then resolved with rest. The chest pain was associated with SOB, diaphoresis and nausea. She has noticed worsening SOB with exertion lately.  She has not had any further chest pain since that time but has not exerted herself. Per patient, this chest pain completely unlike the chest pain she had in 2011, which was more c/w GERD  She has chronic LE edema for which she has worn compression socks for around 30 years. Sometimes, she has some difficulty lying flat due to dyspnea. No PND. No dizziness or palpitations. She denies blood in her stool or urine. She does have a hx of free bleeding due to VWD. She was seen by a hematologist over 30 years ago who told her to avoid ASA and NSAIDs. She has not had any major bleeding.   She does not smoke cigarettes and denies illicit drug use. She drinks occasionally. She has two kids.   Her mother had her first MI when was 68. Her maternal GF had his first event in his late 52s but he was a smoker. Her father had atrial fibrillation but no CAD. Both daughter have had thyroid issues.      Past Medical History  Diagnosis Date  . Fibroid   . Osteopenia   . DUB (dysfunctional uterine bleeding)   . Thyroid disease     Hyperthyroid  . GERD (gastroesophageal reflux disease)   . Hypertension   . Elevated cholesterol   . Family history of cancer     Past Surgical History  Procedure Laterality Date  . Tubal ligation    . Oophorectomy  1998    RSO  . Vaginal hysterectomy  1998    one ovary intact     Current Outpatient Prescriptions  Medication Sig Dispense Refill  . atorvastatin (LIPITOR) 10 MG tablet Take 5 mg by mouth daily.    . Calcium Carbonate-Vitamin D (CALCIUM + D PO) Take 1 tablet by mouth daily.     . fluticasone (FLONASE) 50 MCG/ACT nasal spray Place 1 spray into both nostrils daily as needed for allergies.   11  . hydrochlorothiazide (HYDRODIURIL) 25 MG tablet Take 25 mg by mouth daily.  11  . methimazole (TAPAZOLE) 10 MG tablet Take 5 mg by mouth daily.  3  . Multiple Vitamin (MULTIVITAMIN) tablet Take 1 tablet by mouth daily.    . nitroGLYCERIN (NITROSTAT) 0.4 MG SL tablet Place 0.4 mg under the tongue every 5 (five) minutes as needed for chest pain (3 doses max).    Marland Kitchen  Ranitidine HCl (ZANTAC PO) Take 1 tablet by mouth 2 (two) times daily.      No current facility-administered medications for this visit.    Allergies:   Black pepper; Fish allergy; Shellfish allergy; Wasp venom; Aspirin; Ibuprofen; and Penicillins    Social History:  The patient  reports that she has quit smoking. She has never used smokeless tobacco. She reports that she drinks about 3.0 oz of alcohol per week. She reports that she does not use illicit drugs.   Family History:  The patient's family history includes Breast cancer (age of onset: 34) in her maternal aunt; Cancer in her brother and brother; Colon cancer in her mother; Heart disease in her brother and mother; Hypertension in her mother; Leukemia in her father; Melanoma in her brother; Osteoporosis in her mother;  Ovarian cancer in her mother; Prostate cancer in her father; Skin cancer in her brother, brother, father, mother, and sister.    ROS:  Please see the history of present illness.   Otherwise, review of systems are positive for NONE.   All other systems are reviewed and negative.    PHYSICAL EXAM: VS:  BP 160/86 mmHg  Pulse 82  Ht 5\' 4"  (1.626 m)  Wt 161 lb 9.6 oz (73.301 kg)  BMI 27.72 kg/m2  LMP 09/04/1996 , BMI Body mass index is 27.72 kg/(m^2). GEN: Well nourished, well developed, in no acute distress HEENT: normal Neck: no JVD, carotid bruits, or masses Cardiac: RRR; no murmurs, rubs, or gallops, trace LE bilateral edema  Respiratory:  clear to auscultation bilaterally, normal work of breathing GI: soft, nontender, nondistended, + BS MS: no deformity or atrophy Skin: warm and dry, no rash Neuro:  Strength and sensation are intact Psych: euthymic mood, full affect   EKG:  EKG from Dr. [name] office is NSR with no acute ST or TW changes.     Recent Labs: No results found for requested labs within last 365 days.    Lipid Panel No results found for: CHOL, TRIG, HDL, CHOLHDL, VLDL, LDLCALC, LDLDIRECT    Wt Readings from Last 3 Encounters:  09/09/15 161 lb 9.6 oz (73.301 kg)  05/06/15 155 lb (70.308 kg)  01/06/14 160 lb (72.576 kg)      Other studies Reviewed: Additional studies/ records that were reviewed today include:stress echo, ECG Review of the above records demonstrates: see HPI   ASSESSMENT AND PLAN:  Megan Huang is a 70 y.o. female with a history of HTN, HLD, hyperthyroidism on Tapazole, von willebrand disease and no past cardiac history who presents to clinic for evaluation of chest pain.   Chest pain: she does have RFs for CAD including HTN, HLD and family hx ( mother had MI in early 74s). With her history of VWD, we will proceed with less invasive strategy first and plan for ETT Myoview. We have also asked her to see hematology in the case that her  nuclear stress test returns abnormal and she may need a LHC/PCI requiring DAPT. She agrees with this plan. Dr. Laurann Montana has provided her with SL NTG.   HTN: BP a little elevated today, but per patient- this fluctuates. She regularly take her BP at home and most readings have been well controlled. Continue HCTZ 25mg  daily.  HLD: followed by PCP. Well controlled in 12/2014.   GERD: controlled on Ranitidine    Current medicines are reviewed at length with the patient today.  The patient does not have concerns regarding medicines.  The following changes  have been made:  no change  Labs/ tests ordered today include:   Orders Placed This Encounter  Procedures  . Ambulatory referral to Hematology  . Myocardial Perfusion Imaging     Disposition:   FU with me at the end of January or sooner if her nuclear stress test is abnormal.   Signed, Eileen Stanford, PA-C  09/09/2015 [facility] [address] [place], [place], [place]  [address] Phone: [phone]; Fax: [phone]

## 2015-09-09 ENCOUNTER — Encounter: Payer: Self-pay | Admitting: Physician Assistant

## 2015-09-09 ENCOUNTER — Encounter: Payer: Self-pay | Admitting: *Deleted

## 2015-09-09 ENCOUNTER — Ambulatory Visit (INDEPENDENT_AMBULATORY_CARE_PROVIDER_SITE_OTHER): Payer: BLUE CROSS/BLUE SHIELD | Admitting: Physician Assistant

## 2015-09-09 VITALS — BP 160/86 | HR 82 | Ht 64.0 in | Wt 161.6 lb

## 2015-09-09 DIAGNOSIS — D6800 Von Willebrand disease, unspecified: Secondary | ICD-10-CM

## 2015-09-09 DIAGNOSIS — R079 Chest pain, unspecified: Secondary | ICD-10-CM | POA: Diagnosis not present

## 2015-09-09 NOTE — Patient Instructions (Signed)
Medication Instructions:  Your physician recommends that you continue on your current medications as directed. Please refer to the Current Medication list given to you today.   Labwork: None ordered  Testing/Procedures: Your physician has requested that you have en exercise stress myoview. For further information please visit [email or web address]. Please follow instruction sheet, as given. We will call you with these results.    Follow-Up: Your physician recommends that you schedule a follow-up appointment on:  09/29/15 WITH KATIE THOMPSON, PA-C   Any Other Special Instructions Will Be Listed Below (If Applicable).   If you need a refill on your cardiac medications before your next appointment, please call your pharmacy.

## 2015-09-13 ENCOUNTER — Telehealth (HOSPITAL_COMMUNITY): Payer: Self-pay | Admitting: *Deleted

## 2015-09-13 NOTE — Telephone Encounter (Signed)
Patient given detailed instructions per Myocardial Perfusion Study Information Sheet for the test on 09/15/15 at 7:45. Patient notified to arrive 15 minutes early and that it is imperative to arrive on time for appointment to keep from having the test rescheduled.  If you need to cancel or reschedule your appointment, please call the office within 24 hours of your appointment. Failure to do so may result in a cancellation of your appointment, and a $50 no show fee. Patient verbalized understanding.Megan Huang

## 2015-09-14 ENCOUNTER — Telehealth: Payer: Self-pay | Admitting: Hematology

## 2015-09-14 NOTE — Telephone Encounter (Signed)
PT AWARE OF NP APPT ON 09/23/15@10 :30 DX-VONWILLEBRAND'S DISEASE REFERRING DR Grandville Silos

## 2015-09-15 ENCOUNTER — Ambulatory Visit (HOSPITAL_COMMUNITY): Payer: BLUE CROSS/BLUE SHIELD | Attending: Cardiovascular Disease

## 2015-09-15 DIAGNOSIS — I10 Essential (primary) hypertension: Secondary | ICD-10-CM | POA: Diagnosis not present

## 2015-09-15 DIAGNOSIS — R110 Nausea: Secondary | ICD-10-CM | POA: Insufficient documentation

## 2015-09-15 DIAGNOSIS — R079 Chest pain, unspecified: Secondary | ICD-10-CM | POA: Diagnosis not present

## 2015-09-15 DIAGNOSIS — R0609 Other forms of dyspnea: Secondary | ICD-10-CM | POA: Insufficient documentation

## 2015-09-15 LAB — MYOCARDIAL PERFUSION IMAGING
CHL CUP MPHR: 152 {beats}/min
CHL CUP NUCLEAR SRS: 0
CHL CUP NUCLEAR SSS: 1
CSEPED: 9 min
CSEPHR: 93 %
CSEPPHR: 142 {beats}/min
Estimated workload: 10.1 METS
Exercise duration (sec): 0 s
LHR: 0.26
LVDIAVOL: 62 mL
LVSYSVOL: 15 mL
NUC STRESS TID: 0.92
Rest HR: 68 {beats}/min
SDS: 1

## 2015-09-15 MED ORDER — TECHNETIUM TC 99M SESTAMIBI GENERIC - CARDIOLITE
10.1000 | Freq: Once | INTRAVENOUS | Status: AC | PRN
  Administered 2015-09-15: 10.1 via INTRAVENOUS

## 2015-09-15 MED ORDER — TECHNETIUM TC 99M SESTAMIBI GENERIC - CARDIOLITE
32.6000 | Freq: Once | INTRAVENOUS | Status: AC | PRN
  Administered 2015-09-15: 33 via INTRAVENOUS

## 2015-09-16 ENCOUNTER — Telehealth: Payer: Self-pay | Admitting: *Deleted

## 2015-09-16 NOTE — Telephone Encounter (Signed)
-----   Message from Eileen Stanford, PA-C sent at 09/15/2015  4:47 PM EST ----- Can you let her know that her stress test looked normal? Thanks!

## 2015-09-16 NOTE — Telephone Encounter (Signed)
Per Nell Range, PA-C, called pt and advised her of her stress test.  Pt verbalized understanding.

## 2015-09-23 ENCOUNTER — Other Ambulatory Visit (HOSPITAL_COMMUNITY)
Admission: RE | Admit: 2015-09-23 | Discharge: 2015-09-23 | Disposition: A | Discharge status: Home / Self Care | Payer: BLUE CROSS/BLUE SHIELD | Source: Other Acute Inpatient Hospital | Attending: Hematology | Admitting: Hematology

## 2015-09-23 ENCOUNTER — Encounter: Payer: Self-pay | Admitting: Hematology

## 2015-09-23 ENCOUNTER — Ambulatory Visit (HOSPITAL_BASED_OUTPATIENT_CLINIC_OR_DEPARTMENT_OTHER): Payer: BLUE CROSS/BLUE SHIELD

## 2015-09-23 ENCOUNTER — Ambulatory Visit (HOSPITAL_BASED_OUTPATIENT_CLINIC_OR_DEPARTMENT_OTHER): Payer: BLUE CROSS/BLUE SHIELD | Admitting: Hematology

## 2015-09-23 VITALS — BP 148/73 | HR 75 | Temp 97.7°F | Resp 18 | Ht 64.0 in | Wt 158.6 lb

## 2015-09-23 DIAGNOSIS — R233 Spontaneous ecchymoses: Secondary | ICD-10-CM | POA: Diagnosis not present

## 2015-09-23 DIAGNOSIS — D6800 Von Willebrand disease, unspecified: Secondary | ICD-10-CM | POA: Insufficient documentation

## 2015-09-23 DIAGNOSIS — D699 Hemorrhagic condition, unspecified: Secondary | ICD-10-CM | POA: Diagnosis not present

## 2015-09-23 LAB — CBC & DIFF AND RETIC
BASO%: 0.5 % (ref 0.0–2.0)
BASOS ABS: 0 10*3/uL (ref 0.0–0.1)
EOS%: 0.9 % (ref 0.0–7.0)
Eosinophils Absolute: 0.1 10*3/uL (ref 0.0–0.5)
HEMATOCRIT: 42.2 % (ref 34.8–46.6)
HGB: 14.7 g/dL (ref 11.6–15.9)
Immature Retic Fract: 1 % — ABNORMAL LOW (ref 1.60–10.00)
LYMPH#: 2 10*3/uL (ref 0.9–3.3)
LYMPH%: 34.3 % (ref 14.0–49.7)
MCH: 29.4 pg (ref 25.1–34.0)
MCHC: 34.8 g/dL (ref 31.5–36.0)
MCV: 84.4 fL (ref 79.5–101.0)
MONO#: 0.4 10*3/uL (ref 0.1–0.9)
MONO%: 7.4 % (ref 0.0–14.0)
NEUT#: 3.2 10*3/uL (ref 1.5–6.5)
NEUT%: 56.9 % (ref 38.4–76.8)
PLATELETS: 162 10*3/uL (ref 145–400)
RBC: 5 10*6/uL (ref 3.70–5.45)
RDW: 12.2 % (ref 11.2–14.5)
Retic %: 1.4 % (ref 0.70–2.10)
Retic Ct Abs: 70 10*3/uL (ref 33.70–90.70)
WBC: 5.7 10*3/uL (ref 3.9–10.3)

## 2015-09-23 LAB — COMPREHENSIVE METABOLIC PANEL
ALT: 31 U/L (ref 0–55)
ANION GAP: 10 meq/L (ref 3–11)
AST: 27 U/L (ref 5–34)
Albumin: 4.6 g/dL (ref 3.5–5.0)
Alkaline Phosphatase: 62 U/L (ref 40–150)
BUN: 14.2 mg/dL (ref 7.0–26.0)
CALCIUM: 9.8 mg/dL (ref 8.4–10.4)
CHLORIDE: 100 meq/L (ref 98–109)
CO2: 26 mEq/L (ref 22–29)
Creatinine: 0.8 mg/dL (ref 0.6–1.1)
EGFR: 80 mL/min/{1.73_m2} — AB (ref >90)
Glucose: 100 mg/dl (ref 70–140)
POTASSIUM: 3.7 meq/L (ref 3.5–5.1)
Sodium: 136 mEq/L (ref 136–145)
Total Bilirubin: 0.61 mg/dL (ref 0.20–1.20)
Total Protein: 7.7 g/dL (ref 6.4–8.3)

## 2015-09-23 LAB — PROTIME-INR
INR: 1 — AB (ref 2.00–3.50)
PROTIME: 12 s (ref 10.6–13.4)

## 2015-09-23 LAB — CHCC SMEAR

## 2015-09-23 LAB — PLATELET FUNCTION ASSAY: Collagen / Epinephrine: 145 seconds (ref 0–193)

## 2015-09-23 NOTE — Progress Notes (Signed)
Marland Kitchen    HEMATOLOGY/ONCOLOGY CONSULTATION NOTE  Date of Service: 09/23/2015  Patient Care Team: Lavone Orn, MD as PCP - General (Internal Medicine)  CHIEF COMPLAINTS/PURPOSE OF CONSULTATION:  Easy bruisability ?VWD  HISTORY OF PRESENTING ILLNESS:  Megan Huang is a wonderful 70 y.o. female who has been referred to [place] by Dr Irven Shelling, MD  for evaluation and management of possible bleeding disorder in the setting of easy bruisability and history of heavy periods.  Patient has a history of hypertension, dyslipidemia, family history of cancer as noted below, BRCA-2 variant of unknown significance was seen in the cardiology clinic recently for chest pain. They noted that she might have a history of Von Willebrand Disease and that she was seen by hematologist over 30 years ago and told to avoid aspirin and NSAIDs.  She had some heavy periods when she was younger before she was diagnosed with uterine fibroids. She has since had a hysterectomy for fibroids related bleeding. Notes some increase skin bruisability but no overt epistaxis or gum bleeding. No previous joint bleeds or muscle bleeds. No previous history of significant GI bleeding or CNS bleeds.  Notes no issues with excessive bleeding during her surgeries including vaginal hysterectomy and oophorectomy. No family history of bleeding disorder.  Since she was being worked up for chest pain by her cardiologist and scheduled for a stress test that worked know if she has Von Willebrand Disease since that might have some bearing on her treatment options.  No chest pain or shortness of breath in clinic at this time.  MEDICAL HISTORY:  Past Medical History  Diagnosis Date  . Fibroid   . Osteopenia   . DUB (dysfunctional uterine bleeding)   . Thyroid disease     Hyperthyroid  . GERD (gastroesophageal reflux disease)   . Hypertension   . Elevated cholesterol   . Family history of cancer    BRCA-2 variant of unknown  significance - no other history of ovarian cancer at age 53 years and colon cancer and is alive at age 21 years. 2 brothers with prostate cancer 2 aunts with breast cancer.  SURGICAL HISTORY: Past Surgical History  Procedure Laterality Date  . Tubal ligation    . Oophorectomy  1998    RSO  . Vaginal hysterectomy  1998    one ovary intact    SOCIAL HISTORY: Social History   Social History  . Marital Status: Married    Spouse Name: N/A  . Number of Children: N/A  . Years of Education: N/A   Occupational History  . Not on file.   Social History Main Topics  . Smoking status: Former Research scientist (life sciences)  . Smokeless tobacco: Never Used  . Alcohol Use: 3.0 oz/week    5 Glasses of wine per week  . Drug Use: No  . Sexual Activity: Yes    Birth Control/ Protection: Surgical   Other Topics Concern  . Not on file   Social History Narrative    FAMILY HISTORY: Family History  Problem Relation Age of Onset  . Heart disease Mother   . Hypertension Mother   . Ovarian cancer Mother   . Colon cancer Mother   . Skin cancer Mother   . Osteoporosis Mother   . Heart disease Brother   . Cancer Brother     Prostate cancer  . Melanoma Brother   . Skin cancer Brother   . Breast cancer Maternal Aunt 80    deceased 61  . Cancer Brother  Prostate cancer  . Skin cancer Brother   . Leukemia Father   . Prostate cancer Father   . Skin cancer Father   . Skin cancer Sister     ALLERGIES:  is allergic to black pepper; fish allergy; shellfish allergy; wasp venom; aspirin; ibuprofen; and penicillins.  MEDICATIONS:  Current Outpatient Prescriptions  Medication Sig Dispense Refill  . atorvastatin (LIPITOR) 10 MG tablet Take 5 mg by mouth daily.    . Calcium Carbonate-Vitamin D (CALCIUM + D PO) Take 1 tablet by mouth daily.     . fluticasone (FLONASE) 50 MCG/ACT nasal spray Place 1 spray into both nostrils daily as needed for allergies.   11  . hydrochlorothiazide (HYDRODIURIL) 25 MG tablet  Take 25 mg by mouth daily.  11  . methimazole (TAPAZOLE) 10 MG tablet Take 5 mg by mouth daily.  3  . Multiple Vitamin (MULTIVITAMIN) tablet Take 1 tablet by mouth daily.    . nitroGLYCERIN (NITROSTAT) 0.4 MG SL tablet Place 0.4 mg under the tongue every 5 (five) minutes as needed for chest pain (3 doses max).    . Ranitidine HCl (ZANTAC PO) Take 1 tablet by mouth 2 (two) times daily.      No current facility-administered medications for this visit.    REVIEW OF SYSTEMS:    10 Point review of Systems was done is negative except as noted above.  PHYSICAL EXAMINATION: ECOG PERFORMANCE STATUS: 1 - Symptomatic but completely ambulatory  . Filed Vitals:   09/23/15 1102  BP: 148/73  Pulse: 75  Temp: 97.7 F (36.5 C)  Resp: 18   Filed Weights   09/23/15 1102  Weight: 158 lb 9.6 oz (71.94 kg)   .Body mass index is 27.21 kg/(m^2).  GENERAL:alert, in no acute distress and comfortable SKIN: skin color, texture, turgor are normal, no rashes or significant lesions EYES: normal, conjunctiva are pink and non-injected, sclera clear OROPHARYNX:no exudate, no erythema and lips, buccal mucosa, and tongue normal  NECK: supple, no JVD, thyroid normal size, non-tender, without nodularity LYMPH:  no palpable lymphadenopathy in the cervical, axillary or inguinal LUNGS: clear to auscultation with normal respiratory effort HEART: regular rate & rhythm,  no murmurs and no lower extremity edema ABDOMEN: abdomen soft, non-tender, normoactive bowel sounds  Musculoskeletal: no cyanosis of digits and no clubbing  PSYCH: alert & oriented x 3 with fluent speech NEURO: no focal motor/sensory deficits  LABORATORY DATA:  I have reviewed the data as listed  Component     Latest Ref Rng 09/23/2015  WBC     3.9 - 10.3 10e3/uL 5.7  NEUT#     1.5 - 6.5 10e3/uL 3.2  Hemoglobin     11.6 - 15.9 g/dL 14.7  HCT     34.8 - 46.6 % 42.2  Platelets     145 - 400 10e3/uL 162  MCV     79.5 - 101.0 fL 84.4    MCH     25.1 - 34.0 pg 29.4  MCHC     31.5 - 36.0 g/dL 34.8  RBC     3.70 - 5.45 10e6/uL 5.00  RDW     11.2 - 14.5 % 12.2  lymph#     0.9 - 3.3 10e3/uL 2.0  MONO#     0.1 - 0.9 10e3/uL 0.4  Eosinophils Absolute     0.0 - 0.5 10e3/uL 0.1  Basophils Absolute     0.0 - 0.1 10e3/uL 0.0  NEUT%     38.4 - 76.8 %  56.9  LYMPH%     14.0 - 49.7 % 34.3  MONO%     0.0 - 14.0 % 7.4  EOS%     0.0 - 7.0 % 0.9  BASO%     0.0 - 2.0 % 0.5  Retic %     0.70 - 2.10 % 1.40  Retic Ct Abs     33.70 - 90.70 10e3/uL 70.00  Immature Retic Fract     1.60 - 10.00 % 1.00 (L)  Sodium     136 - 145 mEq/L 136  Potassium     3.5 - 5.1 mEq/L 3.7  Chloride     98 - 109 mEq/L 100  CO2     22 - 29 mEq/L 26  Glucose     70 - 140 mg/dl 100  BUN     7.0 - 26.0 mg/dL 14.2  Creatinine     0.6 - 1.1 mg/dL 0.8  Total Bilirubin     0.20 - 1.20 mg/dL 0.61  Alkaline Phosphatase     40 - 150 U/L 62  AST     5 - 34 U/L 27  ALT     0 - 55 U/L 31  Total Protein     6.4 - 8.3 g/dL 7.7  Albumin     3.5 - 5.0 g/dL 4.6  Calcium     8.4 - 10.4 mg/dL 9.8  Anion gap     3 - 11 mEq/L 10  EGFR     >90 ml/min/1.73 m2 80 (L)  Factor VIII Activity     57 - 163 % 89  von Willebrand Factor (vWF) Ag     50 - 200 % 71  vWF Activity     50 - 200 % 92  Interpretation      Note  PDF Image      .  Protime     10.6 - 13.4 Seconds 12.0  INR     2.00 - 3.50 1.00 (L)  Lovenox      No  PFA Interpretation        Collagen / Epinephrine     0 - 193 seconds 145  PFA to [facility]      Specimen sent to [facility] lab for testing. See results in EPIC.  APTT     24 - 33 sec 29  Von Willebrand Multimers      Comment           RADIOGRAPHIC STUDIES: I have personally reviewed the radiological images as listed and agreed with the findings in the report. No results found.  ASSESSMENT & PLAN:   70 year old Caucasian female with  1) Easy bruisability 2) ? Unclear h/o Von Willebrands  disease. Patient's bleeding score survey, lack of family history and lack of significant personal history of bleeding does not suggest a primary bleeding diathesis. She has normal platelet counts and normal platelet function assay ruling out any quantitative or qualitative platelet defects. She has normal PT and PTT  Von Willebrand panel shows normal antigen levels, normal activity levels and normal  Multimer pattern and normal FVIII levels. These results do not suggest the presence of von Willebrand's disease.  PLAN -No  Indication for any significant bleeding diathesis noted. -no specific treatment recommendations at this time. -I do not see an obvious contraindication to appropriate cardiac intervention or antiplatelet therapies if indicated.  Continue follow-up with primary care physician and cardiology. Kindly consult [place] if any new questions or  concerns arise.  All of the patients questions were answered with apparent satisfaction. The patient knows to call the clinic with any problems, questions or concerns.  I spent 60 minutes counseling the patient face to face. The total time spent in the appointment was 60 minutes and more than 50% was on counseling and direct patient cares.    Sullivan Lone MD [place] AAHIVMS [facility] Hematology/Oncology Physician [facility]  (Office):       [phone] (Work cell):  [phone] (Fax):           [phone]  09/23/2015 11:12 AM

## 2015-09-24 LAB — APTT: aPTT: 29 s (ref 24–33)

## 2015-09-25 LAB — VON WILLEBRAND PANEL
FACTOR VIII ACTIVITY: 89 % (ref 57–163)
PDF Image: 0
VON WILLEBRAND AG: 71 % (ref 50–200)
VON WILLEBRAND FACTOR: 92 % (ref 50–200)

## 2015-09-27 NOTE — Progress Notes (Signed)
Cardiology Office Note   Date:  09/29/2015   ID:  Megan Huang, DOB 10/03/1945, MRN [id number]  PCP:  Irven Shelling, MD  Cardiologist:  Dr. Burt Knack  Follow up: Chest pain    History of Present Illness: Megan Huang is a 70 y.o. female with a history of HTN, HLD, hyperthyroidism on Tapazole, von willebrand disease and no past cardiac history who presents to clinic for evaluation of chest pain.   She had a stress ECHO in 2011 during an admission for chest pain which returned low risk. She does have a hx of free bleeding due to VWD. She was seen by a hematologist over 30 years ago who told her to avoid ASA and NSAIDs. She has not had any major bleeding.   She was referred to [place] by her PCP, Dr. [name], for recent exertional angina. I saw her in clinic on 09/08/15 as a new patient for evaluation of chest pain. Dr. Burt Knack also saw patient as she was a new patient. We set up an exercise myoview which returned low risk. EF >65%.  Today she presents to clinic for follow up. She has been feeling well. She has had a few twinges of chest pain and SOB but nothing like before. She was very pleased with her stress test results. BPs have been good but still labile. She had chronic LE edema and sometimes has to prop up on pillows no PND. Otherwise feeling quite well and has no complaints. She is taking a long vacation to the [place] next month.    Past Medical History  Diagnosis Date  . Fibroid   . Osteopenia   . DUB (dysfunctional uterine bleeding)   . Thyroid disease     Hyperthyroid  . GERD (gastroesophageal reflux disease)   . Hypertension   . Elevated cholesterol   . Family history of cancer     Past Surgical History  Procedure Laterality Date  . Tubal ligation    . Oophorectomy  1998    RSO  . Vaginal hysterectomy  1998    one ovary intact     Current Outpatient Prescriptions  Medication Sig Dispense Refill  . atorvastatin (LIPITOR) 10 MG tablet Take 5 mg by mouth daily.      . Calcium Carbonate-Vitamin D (CALCIUM + D PO) Take 1 tablet by mouth daily.     . fluticasone (FLONASE) 50 MCG/ACT nasal spray Place 1 spray into both nostrils daily as needed for allergies.   11  . hydrochlorothiazide (HYDRODIURIL) 25 MG tablet Take 25 mg by mouth daily.  11  . methimazole (TAPAZOLE) 10 MG tablet Take 5 mg by mouth daily.  3  . Multiple Vitamin (MULTIVITAMIN) tablet Take 1 tablet by mouth daily.    . nitroGLYCERIN (NITROSTAT) 0.4 MG SL tablet Place 0.4 mg under the tongue every 5 (five) minutes as needed for chest pain (3 doses max).    . Ranitidine HCl (ZANTAC PO) Take 1 tablet by mouth 2 (two) times daily.      No current facility-administered medications for this visit.    Allergies:   Black pepper; Fish allergy; Shellfish allergy; Wasp venom; Aspirin; Ibuprofen; and Penicillins    Social History:  The patient  reports that she has quit smoking. She has never used smokeless tobacco. She reports that she drinks about 3.0 oz of alcohol per week. She reports that she does not use illicit drugs.   Family History:  The patient's family history includes Breast cancer (  age of onset: 43) in her maternal aunt; Cancer in her brother and brother; Colon cancer in her mother; Heart disease in her brother and mother; Hypertension in her mother; Leukemia in her father; Melanoma in her brother; Osteoporosis in her mother; Ovarian cancer in her mother; Prostate cancer in her father; Skin cancer in her brother, brother, father, mother, and sister.    ROS:  Please see the history of present illness.   Otherwise, review of systems are positive for NONE.   All other systems are reviewed and negative.    PHYSICAL EXAM: VS:  BP 136/70 mmHg  Pulse 79  Ht 5\' 4"  (1.626 m)  Wt 158 lb 12.8 oz (72.031 kg)  BMI 27.24 kg/m2  SpO2 99%  LMP 09/04/1996 , BMI Body mass index is 27.24 kg/(m^2). GEN: Well nourished, well developed, in no acute distress HEENT: normal Neck: no JVD, carotid bruits,  or masses Cardiac: RRR; no murmurs, rubs, or gallops, trace LE bilateral edema  Respiratory:  clear to auscultation bilaterally, normal work of breathing GI: soft, nontender, nondistended, + BS MS: no deformity or atrophy Skin: warm and dry, no rash Neuro:  Strength and sensation are intact Psych: euthymic mood, full affect   EKG:  EKG from Dr. [name] office is NSR with no acute ST or TW changes.     Recent Labs: 09/23/2015: ALT 31; BUN 14.2; Creatinine 0.8; HGB 14.7; Platelets 162; Potassium 3.7; Sodium 136    Lipid Panel No results found for: CHOL, TRIG, HDL, CHOLHDL, VLDL, LDLCALC, LDLDIRECT    Wt Readings from Last 3 Encounters:  09/29/15 158 lb 12.8 oz (72.031 kg)  09/23/15 158 lb 9.6 oz (71.94 kg)  09/09/15 161 lb 9.6 oz (73.301 kg)      Other studies Reviewed: Additional studies/ records that were reviewed today include:stress echo, ECG Review of the above records demonstrates: see HPI   ASSESSMENT AND PLAN:  Megan Huang is a 70 y.o. female with a history of HTN, HLD, hyperthyroidism on Tapazole, von willebrand disease and no past cardiac history who presents to clinic for follow up of chest pain.   Chest pain: ETT Myoview low risk. No further chest pain except for a few mild instances but nothing like before. Will continue to monitor. If she continues to do well we may be able to have her follow only with only PCP. She would like to see Dr. Burt Knack back once more before cardiology signs off.   HTN: BP well controlled today. Has a history of labile BPs. She will continue to monitor this at home. Continue HCTZ 25mg  daily.  HLD: followed by PCP. Well controlled in 12/2014.   GERD: controlled on Ranitidine   Hyperthyroid: cont Tapazole   Current medicines are reviewed at length with the patient today.  The patient does not have concerns regarding medicines.  The following changes have been made:  no change  Labs/ tests ordered today include:   No orders of  the defined types were placed in this encounter.    Disposition:   FU with [name] in 6 months.   [name]  09/29/2015 10:52 AM    [name] [place], [place], [place]  [address] Phone: [phone]; Fax: [phone]

## 2015-09-29 ENCOUNTER — Ambulatory Visit (INDEPENDENT_AMBULATORY_CARE_PROVIDER_SITE_OTHER): Payer: BLUE CROSS/BLUE SHIELD | Admitting: Physician Assistant

## 2015-09-29 ENCOUNTER — Encounter: Payer: Self-pay | Admitting: Physician Assistant

## 2015-09-29 VITALS — BP 136/70 | HR 79 | Ht 64.0 in | Wt 158.8 lb

## 2015-09-29 DIAGNOSIS — R079 Chest pain, unspecified: Secondary | ICD-10-CM

## 2015-09-29 NOTE — Patient Instructions (Addendum)
Medication Instructions:  Your physician recommends that you continue on your current medications as directed. Please refer to the Current Medication list given to you today.   Labwork: None ordered  Testing/Procedures: None ordered  Follow-Up: Your physician recommends that you schedule a follow-up appointment in:  [place]   Any Other Special Instructions Will Be Listed Below (If Applicable).     If you need a refill on your cardiac medications before your next appointment, please call your pharmacy.

## 2015-09-30 LAB — VON WILLEBRAND FACTOR MULTIMER

## 2015-12-27 DIAGNOSIS — Z0000 Encounter for general adult medical examination without abnormal findings: Secondary | ICD-10-CM | POA: Diagnosis not present

## 2015-12-27 DIAGNOSIS — Z5181 Encounter for therapeutic drug level monitoring: Secondary | ICD-10-CM | POA: Diagnosis not present

## 2015-12-27 DIAGNOSIS — Z79899 Other long term (current) drug therapy: Secondary | ICD-10-CM | POA: Diagnosis not present

## 2015-12-27 DIAGNOSIS — I10 Essential (primary) hypertension: Secondary | ICD-10-CM | POA: Diagnosis not present

## 2015-12-27 DIAGNOSIS — E7800 Pure hypercholesterolemia, unspecified: Secondary | ICD-10-CM | POA: Diagnosis not present

## 2015-12-27 DIAGNOSIS — K219 Gastro-esophageal reflux disease without esophagitis: Secondary | ICD-10-CM | POA: Diagnosis not present

## 2015-12-27 DIAGNOSIS — Z1389 Encounter for screening for other disorder: Secondary | ICD-10-CM | POA: Diagnosis not present

## 2016-02-03 DIAGNOSIS — H2513 Age-related nuclear cataract, bilateral: Secondary | ICD-10-CM | POA: Diagnosis not present

## 2016-02-03 DIAGNOSIS — H40013 Open angle with borderline findings, low risk, bilateral: Secondary | ICD-10-CM | POA: Diagnosis not present

## 2016-02-03 DIAGNOSIS — D3132 Benign neoplasm of left choroid: Secondary | ICD-10-CM | POA: Diagnosis not present

## 2016-02-03 DIAGNOSIS — H5711 Ocular pain, right eye: Secondary | ICD-10-CM | POA: Diagnosis not present

## 2016-03-10 DIAGNOSIS — A084 Viral intestinal infection, unspecified: Secondary | ICD-10-CM | POA: Diagnosis not present

## 2016-05-17 ENCOUNTER — Encounter: Payer: Self-pay | Admitting: Obstetrics and Gynecology

## 2016-05-17 ENCOUNTER — Ambulatory Visit (INDEPENDENT_AMBULATORY_CARE_PROVIDER_SITE_OTHER): Payer: BLUE CROSS/BLUE SHIELD | Admitting: Obstetrics and Gynecology

## 2016-05-17 ENCOUNTER — Telehealth: Payer: Self-pay | Admitting: Obstetrics and Gynecology

## 2016-05-17 VITALS — BP 118/74 | HR 68 | Resp 12 | Ht 63.75 in | Wt 157.6 lb

## 2016-05-17 DIAGNOSIS — Z803 Family history of malignant neoplasm of breast: Secondary | ICD-10-CM

## 2016-05-17 DIAGNOSIS — Z01419 Encounter for gynecological examination (general) (routine) without abnormal findings: Secondary | ICD-10-CM

## 2016-05-17 DIAGNOSIS — Z8639 Personal history of other endocrine, nutritional and metabolic disease: Secondary | ICD-10-CM

## 2016-05-17 DIAGNOSIS — Z800 Family history of malignant neoplasm of digestive organs: Secondary | ICD-10-CM | POA: Diagnosis not present

## 2016-05-17 DIAGNOSIS — Z8041 Family history of malignant neoplasm of ovary: Secondary | ICD-10-CM | POA: Diagnosis not present

## 2016-05-17 NOTE — Progress Notes (Signed)
70 y.o. [id number] MarriedCaucasianF here for annual exam.   H/O TVH/RSO. No vaginal bleeding. She is sexually active, some dyspareunia, helped with lubricant.  Mother had a BRCA-2 variant. Her mother had stage IV colon(70's) and stage III ovarian (70's), uterine cancer (40's) as well. 3 primaries, still living. The patient saw a geneticist, moms BRCA 2 variant is of unknown significance, no extra testing or surgery was recommended. Geneticist didn't recommend removal of her other ovary.     Patient's last menstrual period was 09/04/1996.          Sexually active: Yes.    The current method of family planning is status post hysterectomy.    Exercising: Yes.    Walk Smoker:  no  Health Maintenance: Pap: 12/07/10 Neg History of abnormal Pap:  no MMG: 07/16/14 BIRADS2 [facility]-Patient states had one this year. Calling to get a copy of it.  Colonoscopy: 2009. Diverticulitis. Mother had colon cancer. BMD: 2014 Osteopenia, getting it next year  TDaP: 2008 PCP   reports that she has quit smoking. She has never used smokeless tobacco. She reports that she drinks about 3.0 oz of alcohol per week . She reports that she does not use drugs.She has 2 daughters and 4 grandchildren (not local)  Past Medical History:  Diagnosis Date  . DUB (dysfunctional uterine bleeding)   . Elevated cholesterol   . Family history of cancer   . Fibroid   . GERD (gastroesophageal reflux disease)   . Hypertension   . Osteopenia   . Thyroid disease    Hyperthyroid    Past Surgical History:  Procedure Laterality Date  . OOPHORECTOMY  1998   RSO  . TUBAL LIGATION    . VAGINAL HYSTERECTOMY  1998   one ovary intact    Current Outpatient Prescriptions  Medication Sig Dispense Refill  . atorvastatin (LIPITOR) 10 MG tablet Take 5 mg by mouth daily.    . Calcium Carbonate-Vitamin D (CALCIUM + D PO) Take 1 tablet by mouth daily.     . fluticasone (FLONASE) 50 MCG/ACT nasal spray Place 1 spray into both nostrils daily  as needed for allergies.   11  . hydrochlorothiazide (HYDRODIURIL) 25 MG tablet Take 25 mg by mouth daily.  11  . methimazole (TAPAZOLE) 10 MG tablet Take 5 mg by mouth daily.  3  . Multiple Vitamin (MULTIVITAMIN) tablet Take 1 tablet by mouth daily.    . Ranitidine HCl (ZANTAC PO) Take 1 tablet by mouth 2 (two) times daily.      No current facility-administered medications for this visit.     Family History  Problem Relation Age of Onset  . Heart disease Mother   . Hypertension Mother   . Ovarian cancer Mother   . Colon cancer Mother   . Skin cancer Mother   . Osteoporosis Mother   . Heart disease Brother   . Cancer Brother     Prostate cancer  . Melanoma Brother   . Skin cancer Brother   . Breast cancer Maternal Aunt 80    deceased 86  . Cancer Brother     Prostate cancer  . Skin cancer Brother   . Leukemia Father   . Prostate cancer Father   . Skin cancer Father   . Skin cancer Sister     Review of Systems  Constitutional: Negative.   HENT: Negative.   Eyes: Negative.   Respiratory: Negative.   Cardiovascular: Negative.   Gastrointestinal: Negative.   Endocrine: Negative.  Genitourinary: Negative.   Musculoskeletal: Negative.   Skin: Negative.   Allergic/Immunologic: Negative.   Neurological: Negative.   Hematological: Negative.   Psychiatric/Behavioral: Negative.     Exam:   BP 118/74 (BP Location: Right Arm, Patient Position: Sitting, Cuff Size: Normal)   Pulse 68   Resp 12   Ht 5' 3.75" (1.619 m)   Wt 157 lb 9.6 oz (71.5 kg)   LMP 09/04/1996   BMI 27.26 kg/m   Weight change: @WEIGHTCHANGE @ Height:   Height: 5' 3.75" (161.9 cm)  Ht Readings from Last 3 Encounters:  05/17/16 5' 3.75" (1.619 m)  09/29/15 5' 4"  (1.626 m)  09/23/15 5' 4"  (1.626 m)    General appearance: alert, cooperative and appears stated age Head: Normocephalic, without obvious abnormality, atraumatic Neck: no adenopathy, supple, symmetrical, trachea midline and thyroid normal  to inspection and palpation Lungs: clear to auscultation bilaterally Breasts: normal appearance, no masses or tenderness Heart: regular rate and rhythm Abdomen: soft, non-tender; bowel sounds normal; no masses,  no organomegaly Extremities: extremities normal, atraumatic, no cyanosis or edema Skin: Skin color, texture, turgor normal. No rashes or lesions Lymph nodes: Cervical, supraclavicular, and axillary nodes normal. No abnormal inguinal nodes palpated Neurologic: Grossly normal   Pelvic: External genitalia:  no lesions              Urethra:  normal appearing urethra with no masses, tenderness or lesions              Bartholins and Skenes: normal                 Vagina: normal appearing vagina with normal color and discharge, no lesions              Cervix: absent               Bimanual Exam:  Uterus:  uterus absent              Adnexa: no mass, fullness, tenderness               Rectovaginal: Confirms               Anus:  normal sphincter tone, no lesions  Chaperone was present for exam.  A:  Well Woman with normal exam  Family history of Mother with ovarian, colon and uterine cancer. She has a variant of BRCA 2, unknown significance. Mother was in her 62's with the ovarian cancer.   H/O vit D def  P:   We discussed the option of doing nothing, removing her remaining ovary, testing for the variant (wasn't recommended), doing CA 125's and pelvic ultrasounds  She would like to have a CA 125 and pelvic ultrasound at this point  Colonoscopy was every 5 years, now 10 years  Mammogram due in 12/17  She will get me a copy of her last DEXA (she is wondering if it should be done sooner than next year)  She is getting calcium and vit D  Doing breast self exams  Will check a vit D level today

## 2016-05-17 NOTE — Telephone Encounter (Signed)
Patient returned call

## 2016-05-17 NOTE — Patient Instructions (Signed)

## 2016-05-17 NOTE — Telephone Encounter (Signed)
Called patient to review benefits for a recommended procedure. Left Voicemail requesting a call back. °

## 2016-05-18 LAB — VITAMIN D 25 HYDROXY (VIT D DEFICIENCY, FRACTURES): Vit D, 25-Hydroxy: 38 ng/mL (ref 30–100)

## 2016-05-18 LAB — CA 125: CA 125: 8 U/mL (ref <35)

## 2016-05-18 NOTE — Telephone Encounter (Signed)
Megan Huang returned call. Spoke with Megan Huang regarding benefit for ultrasound. Megan Huang understood and agreeable. Megan Huang ready to schedule. Megan Huang scheduled 05/23/16 with Dr Talbert Nan. Megan Huang aware of arrival date,time and cancellation policy. No further questions. Ok to close

## 2016-05-18 NOTE — Telephone Encounter (Signed)
Called patient to review benefits for a recommended procedure. Left message requesting a call back. °

## 2016-05-23 ENCOUNTER — Encounter: Payer: Self-pay | Admitting: Obstetrics and Gynecology

## 2016-05-23 ENCOUNTER — Ambulatory Visit (INDEPENDENT_AMBULATORY_CARE_PROVIDER_SITE_OTHER): Payer: BLUE CROSS/BLUE SHIELD | Admitting: Obstetrics and Gynecology

## 2016-05-23 ENCOUNTER — Ambulatory Visit (INDEPENDENT_AMBULATORY_CARE_PROVIDER_SITE_OTHER): Payer: BLUE CROSS/BLUE SHIELD

## 2016-05-23 VITALS — BP 124/80 | HR 64 | Resp 14

## 2016-05-23 DIAGNOSIS — Z8041 Family history of malignant neoplasm of ovary: Secondary | ICD-10-CM

## 2016-05-23 DIAGNOSIS — M8580 Other specified disorders of bone density and structure, unspecified site: Secondary | ICD-10-CM | POA: Diagnosis not present

## 2016-05-23 NOTE — Progress Notes (Signed)
GYNECOLOGY  VISIT   HPI: 70 y.o.   Married  Caucasian  female   [phone] with Patient's last menstrual period was 09/04/1996.   here for follow up/pelvic U/S.  Family history of ovarian cancer in her mother with a variant of BRCA 2 of unknown significance.   GYNECOLOGIC HISTORY: Patient's last menstrual period was 09/04/1996. Contraception:postmenopause  Menopausal hormone therapy: none         OB History    Gravida Para Term Preterm AB Living   _0 SAB TAB Ectopic Multiple Live Births                     Patient Active Problem List   Diagnosis Date Noted  . VWD (von Willebrand's disease) ([place]) 09/23/2015  . Bleeding diathesis ([place]) 09/23/2015  . Family history of cancer   . Elevated cholesterol   . Fibroid   . Osteopenia   . DUB (dysfunctional uterine bleeding)   . Thyroid disease   . GERD (gastroesophageal reflux disease)   . Hypertension     Past Medical History:  Diagnosis Date  . DUB (dysfunctional uterine bleeding)   . Elevated cholesterol   . Family history of cancer   . Fibroid   . GERD (gastroesophageal reflux disease)   . Hypertension   . Osteopenia   . Osteopenia   . Thyroid disease    Hyperthyroid    Past Surgical History:  Procedure Laterality Date  . OOPHORECTOMY  1998   RSO  . TUBAL LIGATION    . VAGINAL HYSTERECTOMY  1998   one ovary intact    Current Outpatient Prescriptions  Medication Sig Dispense Refill  . atorvastatin (LIPITOR) 10 MG tablet Take 5 mg by mouth daily.    . Calcium Carbonate-Vitamin D (CALCIUM + D PO) Take 1 tablet by mouth daily.     . fluticasone (FLONASE) 50 MCG/ACT nasal spray Place 1 spray into both nostrils daily as needed for allergies.   11  . hydrochlorothiazide (HYDRODIURIL) 25 MG tablet Take 25 mg by mouth daily.  11  . methimazole (TAPAZOLE) 10 MG tablet Take 5 mg by mouth daily.  3  . Multiple Vitamin (MULTIVITAMIN) tablet Take 1 tablet by mouth daily.    . Ranitidine HCl (ZANTAC PO) Take 1  tablet by mouth 2 (two) times daily.      No current facility-administered medications for this visit.      ALLERGIES: Black pepper [piper]; Fish allergy; Shellfish allergy; Wasp venom; Aspirin; Ibuprofen; and Penicillins  Family History  Problem Relation Age of Onset  . Heart disease Mother   . Hypertension Mother   . Ovarian cancer Mother   . Colon cancer Mother   . Skin cancer Mother   . Osteoporosis Mother   . Heart disease Brother   . Cancer Brother     Prostate cancer  . Melanoma Brother   . Skin cancer Brother   . Breast cancer Maternal Aunt 80    deceased 53  . Cancer Brother     Prostate cancer  . Skin cancer Brother   . Leukemia Father   . Prostate cancer Father   . Skin cancer Father   . Skin cancer Sister   . Other Daughter     Mast Cell Disorder/Condition  . Hypoparathyroidism Daughter   . Thyroid disease Daughter   . Other Daughter     Mast Cell Disorder/Condition  . Thyroid disease Daughter  Social History   Social History  . Marital status: Married    Spouse name: N/A  . Number of children: N/A  . Years of education: N/A   Occupational History  . Not on file.   Social History Main Topics  . Smoking status: Former Research scientist (life sciences)  . Smokeless tobacco: Never Used  . Alcohol use 3.0 oz/week    5 Glasses of wine per week  . Drug use: No  . Sexual activity: Yes    Partners: Male    Birth control/ protection: Surgical   Other Topics Concern  . Not on file   Social History Narrative  . No narrative on file    Review of Systems  Constitutional: Negative.   HENT: Negative.   Eyes: Negative.   Respiratory: Negative.   Cardiovascular: Negative.   Gastrointestinal: Negative.   Genitourinary: Negative.   Musculoskeletal: Negative.   Skin: Negative.   Neurological: Negative.   Endo/Heme/Allergies: Negative.   Psychiatric/Behavioral: Negative.     PHYSICAL EXAMINATION:    BP 124/80 (BP Location: Right Arm, Patient Position: Sitting, Cuff  Size: Normal)   Pulse 64   Resp 14   LMP 09/04/1996     General appearance: alert, cooperative and appears stated age  ASSESSMENT FH of ovarian cancer, normal CA 125 and ultrasound DEXA results reviewed, T score not available. DEXA in 5/14 with osteopenia, FRAX calculation of 12 and 2.2%    PLAN Reassured. She will decide going forward if she wants more CA 125's or pelvic ultrasounds She will talk to her primary about a f/u DEXA She is getting calcium, vit D   An After Visit Summary was printed and given to the patient.  10 minutes face to face time of which over 50% was spent in counseling.

## 2016-05-31 DIAGNOSIS — Z23 Encounter for immunization: Secondary | ICD-10-CM | POA: Diagnosis not present

## 2016-07-04 DIAGNOSIS — K219 Gastro-esophageal reflux disease without esophagitis: Secondary | ICD-10-CM | POA: Diagnosis not present

## 2016-07-04 DIAGNOSIS — I10 Essential (primary) hypertension: Secondary | ICD-10-CM | POA: Diagnosis not present

## 2016-07-04 DIAGNOSIS — R109 Unspecified abdominal pain: Secondary | ICD-10-CM | POA: Diagnosis not present

## 2016-08-16 DIAGNOSIS — Z1231 Encounter for screening mammogram for malignant neoplasm of breast: Secondary | ICD-10-CM | POA: Diagnosis not present

## 2016-08-16 DIAGNOSIS — Z803 Family history of malignant neoplasm of breast: Secondary | ICD-10-CM | POA: Diagnosis not present

## 2016-08-30 ENCOUNTER — Encounter: Payer: Self-pay | Admitting: Obstetrics and Gynecology

## 2016-09-29 DIAGNOSIS — J069 Acute upper respiratory infection, unspecified: Secondary | ICD-10-CM | POA: Diagnosis not present

## 2017-03-20 DIAGNOSIS — Z0000 Encounter for general adult medical examination without abnormal findings: Secondary | ICD-10-CM | POA: Diagnosis not present

## 2017-03-20 DIAGNOSIS — E7800 Pure hypercholesterolemia, unspecified: Secondary | ICD-10-CM | POA: Diagnosis not present

## 2017-03-20 DIAGNOSIS — I10 Essential (primary) hypertension: Secondary | ICD-10-CM | POA: Diagnosis not present

## 2017-03-20 DIAGNOSIS — Z23 Encounter for immunization: Secondary | ICD-10-CM | POA: Diagnosis not present

## 2017-03-20 DIAGNOSIS — R7301 Impaired fasting glucose: Secondary | ICD-10-CM | POA: Diagnosis not present

## 2017-03-20 DIAGNOSIS — E0520 Thyrotoxicosis with toxic multinodular goiter without thyrotoxic crisis or storm: Secondary | ICD-10-CM | POA: Diagnosis not present

## 2017-03-20 DIAGNOSIS — K219 Gastro-esophageal reflux disease without esophagitis: Secondary | ICD-10-CM | POA: Diagnosis not present

## 2017-04-05 DIAGNOSIS — H2513 Age-related nuclear cataract, bilateral: Secondary | ICD-10-CM | POA: Diagnosis not present

## 2017-04-05 DIAGNOSIS — D3132 Benign neoplasm of left choroid: Secondary | ICD-10-CM | POA: Diagnosis not present

## 2017-04-05 DIAGNOSIS — H40013 Open angle with borderline findings, low risk, bilateral: Secondary | ICD-10-CM | POA: Diagnosis not present

## 2017-05-24 ENCOUNTER — Ambulatory Visit: Payer: BLUE CROSS/BLUE SHIELD | Admitting: Obstetrics and Gynecology

## 2017-06-01 DIAGNOSIS — Z23 Encounter for immunization: Secondary | ICD-10-CM | POA: Diagnosis not present

## 2017-07-05 ENCOUNTER — Ambulatory Visit: Payer: BLUE CROSS/BLUE SHIELD | Admitting: Obstetrics and Gynecology

## 2017-07-06 DIAGNOSIS — J209 Acute bronchitis, unspecified: Secondary | ICD-10-CM | POA: Diagnosis not present

## 2017-08-15 ENCOUNTER — Ambulatory Visit: Payer: BLUE CROSS/BLUE SHIELD | Admitting: Obstetrics and Gynecology

## 2017-08-17 ENCOUNTER — Encounter: Payer: Self-pay | Admitting: Obstetrics and Gynecology

## 2017-08-17 ENCOUNTER — Other Ambulatory Visit: Payer: Self-pay

## 2017-08-17 ENCOUNTER — Ambulatory Visit (INDEPENDENT_AMBULATORY_CARE_PROVIDER_SITE_OTHER): Payer: BLUE CROSS/BLUE SHIELD | Admitting: Obstetrics and Gynecology

## 2017-08-17 VITALS — BP 134/80 | HR 72 | Resp 16 | Ht 63.5 in | Wt 157.0 lb

## 2017-08-17 DIAGNOSIS — Z01419 Encounter for gynecological examination (general) (routine) without abnormal findings: Secondary | ICD-10-CM

## 2017-08-17 DIAGNOSIS — Z8041 Family history of malignant neoplasm of ovary: Secondary | ICD-10-CM

## 2017-08-17 NOTE — Patient Instructions (Signed)

## 2017-08-17 NOTE — Progress Notes (Signed)
71 y.o. [id number] MarriedCaucasianF here for annual exam.   H/O TVH/RSO. No vaginal bleeding. Sexually active, using a lubricant which helps She tried vaginal estrogen in the past, didn't help much. Declines.  Mother had a BRCA-2 variant. Her mother had stage IV colon(70's) and stage III ovarian (70's), uterine cancer (40's) as well. 3 primaries, still living.The patient saw a geneticist, moms BRCA 2 variant is of unknown significance, no extra testing or surgery was recommended. Geneticist didn't recommend removal of her other ovary.     Patient's last menstrual period was 09/04/1996.          Sexually active: Yes.    The current method of family planning is status post hysterectomy.    Exercising: Yes.    walking and group exercises  Smoker:  Former smoker  Health Maintenance: Pap:  12-07-10 WNL  History of abnormal Pap:  no MMG:  08-16-16 WNL  Colonoscopy:  2008 WNL per patient  BMD:   2014 osteopenia  TDaP:  Up to date  Gardasil: N/A   reports that she has quit smoking. she has never used smokeless tobacco. She reports that she drinks about 3.0 oz of alcohol per week. She reports that she does not use drugs. She has 2 daughters and 4 grandchildren (not local)  Past Medical History:  Diagnosis Date  . DUB (dysfunctional uterine bleeding)   . Elevated cholesterol   . Family history of cancer   . Fibroid   . GERD (gastroesophageal reflux disease)   . Hypertension   . Osteopenia   . Osteopenia   . Thyroid disease    Hyperthyroid    Past Surgical History:  Procedure Laterality Date  . OOPHORECTOMY  1998   RSO  . TUBAL LIGATION    . VAGINAL HYSTERECTOMY  1998   one ovary intact    Current Outpatient Medications  Medication Sig Dispense Refill  . atorvastatin (LIPITOR) 10 MG tablet Take 5 mg by mouth daily.    . Calcium Carbonate-Vitamin D (CALCIUM + D PO) Take 1 tablet by mouth daily.     . cetirizine (ZYRTEC) 10 MG tablet Take 10 mg by mouth daily.    . fluticasone  (FLONASE) 50 MCG/ACT nasal spray Place 1 spray into both nostrils daily as needed for allergies.   11  . hydrochlorothiazide (HYDRODIURIL) 25 MG tablet Take 25 mg by mouth daily.  11  . methimazole (TAPAZOLE) 10 MG tablet Take 5 mg by mouth daily.  3  . Multiple Vitamin (MULTIVITAMIN) tablet Take 1 tablet by mouth daily.    . Ranitidine HCl (ZANTAC PO) Take 1 tablet by mouth 2 (two) times daily.     Marland Kitchen PROAIR HFA 108 (90 Base) MCG/ACT inhaler Inhale 2 puffs into the lungs every 6 (six) hours as needed.  1   No current facility-administered medications for this visit.     Family History  Problem Relation Age of Onset  . Heart disease Mother   . Hypertension Mother   . Ovarian cancer Mother   . Colon cancer Mother   . Skin cancer Mother   . Osteoporosis Mother   . Heart disease Brother   . Cancer Brother        Prostate cancer  . Melanoma Brother   . Skin cancer Brother   . Breast cancer Maternal Aunt 80       deceased 57  . Cancer Brother        Prostate cancer  . Skin cancer Brother   .  Leukemia Father   . Prostate cancer Father   . Skin cancer Father   . Skin cancer Sister   . Other Daughter        Mast Cell Disorder/Condition  . Hypoparathyroidism Daughter   . Thyroid disease Daughter   . Other Daughter        Mast Cell Disorder/Condition  . Thyroid disease Daughter     Review of Systems  Constitutional: Negative.   HENT: Negative.   Eyes: Negative.   Respiratory: Negative.   Cardiovascular: Negative.   Gastrointestinal: Negative.   Endocrine: Negative.   Genitourinary: Negative.   Musculoskeletal: Negative.   Skin: Negative.   Allergic/Immunologic: Negative.   Neurological: Negative.   Psychiatric/Behavioral: Negative.     Exam:   BP 134/80 (BP Location: Right Arm, Patient Position: Sitting, Cuff Size: Normal)   Pulse 72   Resp 16   Ht 5' 3.5" (1.613 m)   Wt 157 lb (71.2 kg)   LMP 09/04/1996   BMI 27.38 kg/m   Weight change: @WEIGHTCHANGE @ Height:    Height: 5' 3.5" (161.3 cm)  Ht Readings from Last 3 Encounters:  08/17/17 5' 3.5" (1.613 m)  05/17/16 5' 3.75" (1.619 m)  09/29/15 5' 4"  (1.626 m)    General appearance: alert, cooperative and appears stated age Head: Normocephalic, without obvious abnormality, atraumatic Neck: no adenopathy, supple, symmetrical, trachea midline and thyroid normal to inspection and palpation Lungs: clear to auscultation bilaterally Cardiovascular: regular rate and rhythm Breasts: normal appearance, no masses or tenderness Abdomen: soft, non-tender; non distended,  no masses,  no organomegaly Extremities: extremities normal, atraumatic, no cyanosis or edema Skin: Skin color, texture, turgor normal. No rashes or lesions Lymph nodes: Cervical, supraclavicular, and axillary nodes normal. No abnormal inguinal nodes palpated Neurologic: Grossly normal   Pelvic: External genitalia:  no lesions              Urethra:  normal appearing urethra with no masses, tenderness or lesions              Bartholins and Skenes: normal                 Vagina: normal appearing vagina with normal color and discharge, no lesions              Cervix: absent               Bimanual Exam:  Uterus:  uterus absent              Adnexa: no mass, fullness, tenderness               Rectovaginal: Confirms               Anus:  normal sphincter tone, no lesions  Chaperone was present for exam.  A:  Well Woman with normal exam  Family h/o ovarian cancer  P:   She would like to just check a CA 125 this year  Mammogram due  She will set up a colonoscopy and DEXA with her primary  Discussed breast self exam  Discussed calcium and vit D intake

## 2017-08-18 LAB — CA 125: CANCER ANTIGEN (CA) 125: 11.8 U/mL (ref 0.0–38.1)

## 2017-08-22 DIAGNOSIS — I10 Essential (primary) hypertension: Secondary | ICD-10-CM | POA: Diagnosis not present

## 2017-08-22 DIAGNOSIS — R002 Palpitations: Secondary | ICD-10-CM | POA: Diagnosis not present

## 2017-08-29 ENCOUNTER — Other Ambulatory Visit: Payer: Self-pay | Admitting: Internal Medicine

## 2017-08-29 ENCOUNTER — Ambulatory Visit (INDEPENDENT_AMBULATORY_CARE_PROVIDER_SITE_OTHER): Payer: BLUE CROSS/BLUE SHIELD

## 2017-08-29 DIAGNOSIS — R002 Palpitations: Secondary | ICD-10-CM

## 2017-09-25 DIAGNOSIS — R232 Flushing: Secondary | ICD-10-CM | POA: Diagnosis not present

## 2017-09-25 DIAGNOSIS — I10 Essential (primary) hypertension: Secondary | ICD-10-CM | POA: Diagnosis not present

## 2017-09-25 DIAGNOSIS — K219 Gastro-esophageal reflux disease without esophagitis: Secondary | ICD-10-CM | POA: Diagnosis not present

## 2017-09-27 DIAGNOSIS — R232 Flushing: Secondary | ICD-10-CM | POA: Diagnosis not present

## 2017-10-01 ENCOUNTER — Encounter: Payer: Self-pay | Admitting: Obstetrics and Gynecology

## 2017-10-01 DIAGNOSIS — Z1231 Encounter for screening mammogram for malignant neoplasm of breast: Secondary | ICD-10-CM | POA: Diagnosis not present

## 2017-10-31 DIAGNOSIS — R232 Flushing: Secondary | ICD-10-CM | POA: Diagnosis not present

## 2018-03-22 DIAGNOSIS — B308 Other viral conjunctivitis: Secondary | ICD-10-CM | POA: Diagnosis not present

## 2018-05-09 DIAGNOSIS — D3132 Benign neoplasm of left choroid: Secondary | ICD-10-CM | POA: Diagnosis not present

## 2018-05-09 DIAGNOSIS — H40013 Open angle with borderline findings, low risk, bilateral: Secondary | ICD-10-CM | POA: Diagnosis not present

## 2018-05-09 DIAGNOSIS — H2513 Age-related nuclear cataract, bilateral: Secondary | ICD-10-CM | POA: Diagnosis not present

## 2018-05-20 DIAGNOSIS — J4520 Mild intermittent asthma, uncomplicated: Secondary | ICD-10-CM | POA: Diagnosis not present

## 2018-05-20 DIAGNOSIS — K219 Gastro-esophageal reflux disease without esophagitis: Secondary | ICD-10-CM | POA: Diagnosis not present

## 2018-05-20 DIAGNOSIS — E7800 Pure hypercholesterolemia, unspecified: Secondary | ICD-10-CM | POA: Diagnosis not present

## 2018-05-20 DIAGNOSIS — E0520 Thyrotoxicosis with toxic multinodular goiter without thyrotoxic crisis or storm: Secondary | ICD-10-CM | POA: Diagnosis not present

## 2018-05-20 DIAGNOSIS — Z0000 Encounter for general adult medical examination without abnormal findings: Secondary | ICD-10-CM | POA: Diagnosis not present

## 2018-05-20 DIAGNOSIS — Z23 Encounter for immunization: Secondary | ICD-10-CM | POA: Diagnosis not present

## 2018-05-20 DIAGNOSIS — I10 Essential (primary) hypertension: Secondary | ICD-10-CM | POA: Diagnosis not present

## 2018-05-20 DIAGNOSIS — R7301 Impaired fasting glucose: Secondary | ICD-10-CM | POA: Diagnosis not present

## 2018-05-28 DIAGNOSIS — D1801 Hemangioma of skin and subcutaneous tissue: Secondary | ICD-10-CM | POA: Diagnosis not present

## 2018-05-28 DIAGNOSIS — L723 Sebaceous cyst: Secondary | ICD-10-CM | POA: Diagnosis not present

## 2018-05-28 DIAGNOSIS — L570 Actinic keratosis: Secondary | ICD-10-CM | POA: Diagnosis not present

## 2018-05-28 DIAGNOSIS — L821 Other seborrheic keratosis: Secondary | ICD-10-CM | POA: Diagnosis not present

## 2018-06-17 DIAGNOSIS — Z01818 Encounter for other preprocedural examination: Secondary | ICD-10-CM | POA: Diagnosis not present

## 2018-06-24 DIAGNOSIS — M8588 Other specified disorders of bone density and structure, other site: Secondary | ICD-10-CM | POA: Diagnosis not present

## 2018-07-02 DIAGNOSIS — L720 Epidermal cyst: Secondary | ICD-10-CM | POA: Diagnosis not present

## 2018-08-19 DIAGNOSIS — K648 Other hemorrhoids: Secondary | ICD-10-CM | POA: Diagnosis not present

## 2018-08-19 DIAGNOSIS — K644 Residual hemorrhoidal skin tags: Secondary | ICD-10-CM | POA: Diagnosis not present

## 2018-08-19 DIAGNOSIS — Z1211 Encounter for screening for malignant neoplasm of colon: Secondary | ICD-10-CM | POA: Diagnosis not present

## 2018-08-19 DIAGNOSIS — Z800 Family history of malignant neoplasm of digestive organs: Secondary | ICD-10-CM | POA: Diagnosis not present

## 2018-08-19 DIAGNOSIS — K5730 Diverticulosis of large intestine without perforation or abscess without bleeding: Secondary | ICD-10-CM | POA: Diagnosis not present

## 2018-09-19 ENCOUNTER — Ambulatory Visit: Payer: BLUE CROSS/BLUE SHIELD | Admitting: Obstetrics and Gynecology

## 2018-10-23 NOTE — Progress Notes (Signed)
73 y.o. [id number] Married White or Caucasian Not Hispanic or Latino female here for annual exam.  H/O TVH/RSO. No concerns.  She see's her internist 2 x a year.  She is sexually active, uses a lubricant, slight discomfort (hasn't liked vaginal estrogen).  Normal bowel and bladder function.   She has allergies, interested in seeing a pulmonologist. She has allergies, trouble with breathing in the cold weather.     Patient's last menstrual period was 09/04/1996.          Sexually active: Yes.    The current method of family planning is status post hysterectomy. Exercising: Yes.    Walking Smoker:  No  Health Maintenance: Pap:  12-07-10 WNL  History of abnormal Pap:  no MMG:  08-16-16 WNL, per patient had one in 2019, will call for report, due next month Colonoscopy:  2019 WNL, due in 5 years BMD:   2020 osteopenia  TDaP:  Up to date with PCP Gardasil: N/A   reports that she has quit smoking. She has never used smokeless tobacco. She reports current alcohol use of about 5.0 standard drinks of alcohol per week. She reports that she does not use drugs. 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 She has 2 daughters, 4 grandchildren (one is a girl).   Past Medical History:  Diagnosis Date  . DUB (dysfunctional uterine bleeding)   . Elevated cholesterol   . Family history of cancer   . Fibroid   . GERD (gastroesophageal reflux disease)   . Hypertension   . Osteopenia   . Osteopenia   . Thyroid disease    Hyperthyroid    Past Surgical History:  Procedure Laterality Date  . OOPHORECTOMY  1998   RSO  . TUBAL LIGATION    . VAGINAL HYSTERECTOMY  1998   one ovary intact    Current Outpatient Medications  Medication Sig Dispense Refill  . atorvastatin (LIPITOR) 10 MG tablet Take 5 mg by mouth daily.    . Calcium Carbonate-Vitamin D (CALCIUM + D PO) Take 1 tablet by mouth daily.     . cetirizine (ZYRTEC) 10 MG tablet Take 10 mg by mouth daily.    .  fluticasone (FLONASE) 50 MCG/ACT nasal spray Place 1 spray into both nostrils daily as needed for allergies.   11  . hydrochlorothiazide (HYDRODIURIL) 25 MG tablet Take 25 mg by mouth daily.  11  . methimazole (TAPAZOLE) 10 MG tablet Take 5 mg by mouth daily.  3  . Multiple Vitamin (MULTIVITAMIN) tablet Take 1 tablet by mouth daily.    Marland Kitchen PROAIR HFA 108 (90 Base) MCG/ACT inhaler Inhale 2 puffs into the lungs every 6 (six) hours as needed.  1  . Ranitidine HCl (ZANTAC PO) Take 1 tablet by mouth daily.      No current facility-administered medications for this visit.     Family History  Problem Relation Age of Onset  . Heart disease Mother   . Hypertension Mother   . Ovarian cancer Mother   . Colon cancer Mother   . Skin cancer Mother   . Osteoporosis Mother   . Heart disease Brother   . Cancer Brother        Prostate cancer  . Melanoma Brother   . Skin cancer Brother   . Breast cancer Maternal Aunt 80       deceased 32  . Cancer Brother        Prostate cancer  . Skin  cancer Brother   . Leukemia Father   . Prostate cancer Father   . Skin cancer Father   . Skin cancer Sister   . Other Daughter        Mast Cell Disorder/Condition  . Hypoparathyroidism Daughter   . Thyroid disease Daughter   . Other Daughter        Mast Cell Disorder/Condition  . Thyroid disease Daughter   Mother had a BRCA-2 variant. Her mother had stage IV colon(70's) and stage III ovarian (70's), uterine cancer (40's) as well. 3 primaries, still living.The patient saw a geneticist, moms BRCA 2 variant is of unknown significance, no extra testing or surgery was recommended, not felt to be a hereditary cancer.Geneticist didn't recommend removal of her other ovary. See genetics note from 6/15.  Review of Systems  Constitutional: Negative.   HENT: Negative.   Eyes: Negative.   Respiratory: Negative.   Cardiovascular: Negative.   Gastrointestinal: Negative.   Endocrine: Negative.   Genitourinary:  Negative.   Musculoskeletal: Negative.   Skin: Negative.   Allergic/Immunologic: Negative.   Neurological: Negative.   Hematological: Negative.   Psychiatric/Behavioral: Negative.     Exam:   BP 140/80 (BP Location: Right Arm, Patient Position: Sitting, Cuff Size: Normal)   Pulse 72   LMP 09/04/1996   Weight change: @WEIGHTCHANGE @ Height:      Ht Readings from Last 3 Encounters:  08/17/17 5' 3.5" (1.613 m)  05/17/16 5' 3.75" (1.619 m)  09/29/15 5' 4"  (1.626 m)    General appearance: alert, cooperative and appears stated age Head: Normocephalic, without obvious abnormality, atraumatic Neck: no adenopathy, supple, symmetrical, trachea midline and thyroid normal to inspection and palpation Lungs: clear to auscultation bilaterally Cardiovascular: regular rate and rhythm Breasts: normal appearance, no masses or tenderness Abdomen: soft, non-tender; non distended,  no masses,  no organomegaly Extremities: extremities normal, atraumatic, no cyanosis or edema Skin: Skin color, texture, turgor normal. No rashes or lesions Lymph nodes: Cervical, supraclavicular, and axillary nodes normal. No abnormal inguinal nodes palpated Neurologic: Grossly normal   Pelvic: External genitalia:  no lesions              Urethra:  normal appearing urethra with no masses, tenderness or lesions              Bartholins and Skenes: normal                 Vagina: normal appearing vagina with normal color and discharge, no lesions              Cervix: absent               Bimanual Exam:  Uterus:  uterus absent              Adnexa: no mass, fullness, tenderness               Rectovaginal: Confirms               Anus:  normal sphincter tone, no lesions  Chaperone was present for exam.  A:  Well Woman with normal exam  FH of ovarian cancer, colon cancer and melaoma  P:   No pap   Mammogram next month  Colonoscopy is UTD  DEXA stable with primary  Discussed breast self exam  Discussed calcium and  vit D intake  CA 125  Other labs with primary MD

## 2018-10-28 ENCOUNTER — Ambulatory Visit (INDEPENDENT_AMBULATORY_CARE_PROVIDER_SITE_OTHER): Payer: BLUE CROSS/BLUE SHIELD | Admitting: Obstetrics and Gynecology

## 2018-10-28 ENCOUNTER — Other Ambulatory Visit: Payer: Self-pay

## 2018-10-28 ENCOUNTER — Encounter: Payer: Self-pay | Admitting: Obstetrics and Gynecology

## 2018-10-28 VITALS — BP 140/80 | HR 72

## 2018-10-28 DIAGNOSIS — Z01419 Encounter for gynecological examination (general) (routine) without abnormal findings: Secondary | ICD-10-CM | POA: Diagnosis not present

## 2018-10-28 DIAGNOSIS — R971 Elevated cancer antigen 125 [CA 125]: Secondary | ICD-10-CM | POA: Diagnosis not present

## 2018-10-28 DIAGNOSIS — Z800 Family history of malignant neoplasm of digestive organs: Secondary | ICD-10-CM

## 2018-10-28 DIAGNOSIS — Z8041 Family history of malignant neoplasm of ovary: Secondary | ICD-10-CM | POA: Diagnosis not present

## 2018-10-28 DIAGNOSIS — Z808 Family history of malignant neoplasm of other organs or systems: Secondary | ICD-10-CM

## 2018-10-28 DIAGNOSIS — Z8739 Personal history of other diseases of the musculoskeletal system and connective tissue: Secondary | ICD-10-CM

## 2018-10-28 NOTE — Patient Instructions (Signed)
EXERCISE AND DIET:  We recommended that you start or continue a regular exercise program for good health. Regular exercise means any activity that makes your heart beat faster and makes you sweat.  We recommend exercising at least 30 minutes per day at least 3 days a week, preferably 4 or 5.  We also recommend a diet low in fat and sugar.  Inactivity, poor dietary choices and obesity can cause diabetes, heart attack, stroke, and kidney damage, among others.    ALCOHOL AND SMOKING:  Women should limit their alcohol intake to no more than 7 drinks/beers/glasses of wine (combined, not each!) per week. Moderation of alcohol intake to this level decreases your risk of breast cancer and liver damage. And of course, no recreational drugs are part of a healthy lifestyle.  And absolutely no smoking or even second hand smoke. Most people know smoking can cause heart and lung diseases, but did you know it also contributes to weakening of your bones? Aging of your skin?  Yellowing of your teeth and nails?  CALCIUM AND VITAMIN D:  Adequate intake of calcium and Vitamin D are recommended.  The recommendations for exact amounts of these supplements seem to change often, but generally speaking 1,200 mg of calcium (between diet and supplement) and 800 units of Vitamin D per day seems prudent. Certain women may benefit from higher intake of Vitamin D.  If you are among these women, your doctor will have told you during your visit.    PAP SMEARS:  Pap smears, to check for cervical cancer or precancers,  have traditionally been done yearly, although recent scientific advances have shown that most women can have pap smears less often.  However, every woman still should have a physical exam from her gynecologist every year. It will include a breast check, inspection of the vulva and vagina to check for abnormal growths or skin changes, a visual exam of the cervix, and then an exam to evaluate the size and shape of the uterus and  ovaries.  And after 72 years of age, a rectal exam is indicated to check for rectal cancers. We will also provide age appropriate advice regarding health maintenance, like when you should have certain vaccines, screening for sexually transmitted diseases, bone density testing, colonoscopy, mammograms, etc.   MAMMOGRAMS:  All women over 40 years old should have a yearly mammogram. Many facilities now offer a "3D" mammogram, which may cost around $50 extra out of pocket. If possible,  we recommend you accept the option to have the 3D mammogram performed.  It both reduces the number of women who will be called back for extra views which then turn out to be normal, and it is better than the routine mammogram at detecting truly abnormal areas.    COLON CANCER SCREENING: Now recommend starting at age 45. At this time colonoscopy is not covered for routine screening until 50. There are take home tests that can be done between 45-49.   COLONOSCOPY:  Colonoscopy to screen for colon cancer is recommended for all women at age 50.  We know, you hate the idea of the prep.  We agree, BUT, having colon cancer and not knowing it is worse!!  Colon cancer so often starts as a polyp that can be seen and removed at colonscopy, which can quite literally save your life!  And if your first colonoscopy is normal and you have no family history of colon cancer, most women don't have to have it again for   10 years.  Once every ten years, you can do something that may end up saving your life, right?  We will be happy to help you get it scheduled when you are ready.  Be sure to check your insurance coverage so you understand how much it will cost.  It may be covered as a preventative service at no cost, but you should check your particular policy.      Breast Self-Awareness Breast self-awareness means being familiar with how your breasts look and feel. It involves checking your breasts regularly and reporting any changes to your  health care provider. Practicing breast self-awareness is important. A change in your breasts can be a sign of a serious medical problem. Being familiar with how your breasts look and feel allows you to find any problems early, when treatment is more likely to be successful. All women should practice breast self-awareness, including women who have had breast implants. How to do a breast self-exam One way to learn what is normal for your breasts and whether your breasts are changing is to do a breast self-exam. To do a breast self-exam: Look for Changes  1. Remove all the clothing above your waist. 2. Stand in front of a mirror in a room with good lighting. 3. Put your hands on your hips. 4. Push your hands firmly downward. 5. Compare your breasts in the mirror. Look for differences between them (asymmetry), such as: ? Differences in shape. ? Differences in size. ? Puckers, dips, and bumps in one breast and not the other. 6. Look at each breast for changes in your skin, such as: ? Redness. ? Scaly areas. 7. Look for changes in your nipples, such as: ? Discharge. ? Bleeding. ? Dimpling. ? Redness. ? A change in position. Feel for Changes Carefully feel your breasts for lumps and changes. It is best to do this while lying on your back on the floor and again while sitting or standing in the shower or tub with soapy water on your skin. Feel each breast in the following way:  Place the arm on the side of the breast you are examining above your head.  Feel your breast with the other hand.  Start in the nipple area and make  inch (2 cm) overlapping circles to feel your breast. Use the pads of your three middle fingers to do this. Apply light pressure, then medium pressure, then firm pressure. The light pressure will allow you to feel the tissue closest to the skin. The medium pressure will allow you to feel the tissue that is a little deeper. The firm pressure will allow you to feel the tissue  close to the ribs.  Continue the overlapping circles, moving downward over the breast until you feel your ribs below your breast.  Move one finger-width toward the center of the body. Continue to use the  inch (2 cm) overlapping circles to feel your breast as you move slowly up toward your collarbone.  Continue the up and down exam using all three pressures until you reach your armpit.  Write Down What You Find  Write down what is normal for each breast and any changes that you find. Keep a written record with breast changes or normal findings for each breast. By writing this information down, you do not need to depend only on memory for size, tenderness, or location. Write down where you are in your menstrual cycle, if you are still menstruating. If you are having trouble noticing differences   in your breasts, do not get discouraged. With time you will become more familiar with the variations in your breasts and more comfortable with the exam. How often should I examine my breasts? Examine your breasts every month. If you are breastfeeding, the best time to examine your breasts is after a feeding or after using a breast pump. If you menstruate, the best time to examine your breasts is 5-7 days after your period is over. During your period, your breasts are lumpier, and it may be more difficult to notice changes. When should I see my health care provider? See your health care provider if you notice:  A change in shape or size of your breasts or nipples.  A change in the skin of your breast or nipples, such as a reddened or scaly area.  Unusual discharge from your nipples.  A lump or thick area that was not there before.  Pain in your breasts.  Anything that concerns you.  

## 2018-10-29 LAB — CA 125: CANCER ANTIGEN (CA) 125: 11 U/mL (ref 0.0–38.1)

## 2018-11-05 DIAGNOSIS — E0520 Thyrotoxicosis with toxic multinodular goiter without thyrotoxic crisis or storm: Secondary | ICD-10-CM | POA: Diagnosis not present

## 2018-11-05 DIAGNOSIS — M8580 Other specified disorders of bone density and structure, unspecified site: Secondary | ICD-10-CM | POA: Diagnosis not present

## 2018-11-05 DIAGNOSIS — J4520 Mild intermittent asthma, uncomplicated: Secondary | ICD-10-CM | POA: Diagnosis not present

## 2018-11-05 DIAGNOSIS — I10 Essential (primary) hypertension: Secondary | ICD-10-CM | POA: Diagnosis not present

## 2018-11-13 ENCOUNTER — Encounter: Payer: Self-pay | Admitting: Obstetrics and Gynecology

## 2018-11-13 DIAGNOSIS — Z1231 Encounter for screening mammogram for malignant neoplasm of breast: Secondary | ICD-10-CM | POA: Diagnosis not present

## 2019-03-10 DIAGNOSIS — E0520 Thyrotoxicosis with toxic multinodular goiter without thyrotoxic crisis or storm: Secondary | ICD-10-CM | POA: Diagnosis not present

## 2019-06-05 DIAGNOSIS — Z23 Encounter for immunization: Secondary | ICD-10-CM | POA: Diagnosis not present

## 2019-06-23 DIAGNOSIS — D3132 Benign neoplasm of left choroid: Secondary | ICD-10-CM | POA: Diagnosis not present

## 2019-06-23 DIAGNOSIS — H40013 Open angle with borderline findings, low risk, bilateral: Secondary | ICD-10-CM | POA: Diagnosis not present

## 2019-06-23 DIAGNOSIS — H2513 Age-related nuclear cataract, bilateral: Secondary | ICD-10-CM | POA: Diagnosis not present

## 2019-06-23 DIAGNOSIS — H35413 Lattice degeneration of retina, bilateral: Secondary | ICD-10-CM | POA: Diagnosis not present

## 2019-07-24 DIAGNOSIS — E7800 Pure hypercholesterolemia, unspecified: Secondary | ICD-10-CM | POA: Diagnosis not present

## 2019-07-24 DIAGNOSIS — J4520 Mild intermittent asthma, uncomplicated: Secondary | ICD-10-CM | POA: Diagnosis not present

## 2019-07-24 DIAGNOSIS — Z0000 Encounter for general adult medical examination without abnormal findings: Secondary | ICD-10-CM | POA: Diagnosis not present

## 2019-07-24 DIAGNOSIS — E0520 Thyrotoxicosis with toxic multinodular goiter without thyrotoxic crisis or storm: Secondary | ICD-10-CM | POA: Diagnosis not present

## 2019-07-24 DIAGNOSIS — Z23 Encounter for immunization: Secondary | ICD-10-CM | POA: Diagnosis not present

## 2019-07-24 DIAGNOSIS — I10 Essential (primary) hypertension: Secondary | ICD-10-CM | POA: Diagnosis not present

## 2019-09-26 ENCOUNTER — Ambulatory Visit: Payer: BLUE CROSS/BLUE SHIELD

## 2019-09-29 DIAGNOSIS — J4530 Mild persistent asthma, uncomplicated: Secondary | ICD-10-CM | POA: Diagnosis not present

## 2019-10-27 DIAGNOSIS — L300 Nummular dermatitis: Secondary | ICD-10-CM | POA: Diagnosis not present

## 2019-10-27 DIAGNOSIS — L308 Other specified dermatitis: Secondary | ICD-10-CM | POA: Diagnosis not present

## 2019-11-04 NOTE — Progress Notes (Signed)
74 y.o. [id number] Married White or Caucasian Not Hispanic or Latino female here for annual exam.   H/O TVH/RSO.  Sexually active, low libido.  No bowel or bladder changes. If she has a bad cough she has mild GSI.     She has been on and off thyroid medication for years, has an appointment to see an Endocrinologist. She has had weight gain and hair loss.   Patient's last menstrual period was 09/04/1996.          Sexually active: Yes.    The current method of family planning is status post hysterectomy.    Exercising: No.  The patient does not participate in regular exercise at present. Smoker:  no  Health Maintenance: Pap:  12/07/10 WNL History of abnormal Pap:  no MMG:  11/13/18 Density B Bi-rads 2 benign  BMD:   2020 Osteopenia, DEXA with primary  Colonoscopy: 2019 WNL, due in 5 years TDaP: Up to date with PCP  Gardasil: NA   reports that she has quit smoking. She has never used smokeless tobacco. She reports current alcohol use of about 5.0 standard drinks of alcohol per week. She reports that she does not use drugs. She is a retired Chief Executive Officer. Husband is 72 and still working as an Dietitian. She has 2 daughters, 4 grandchildren (one is a girl). One daughter is a a Air cabin crew the other is a Marketing executive. Mom is in [place], [place], independent living. She is helping to care of her Mother.   Past Medical History:  Diagnosis Date  . DUB (dysfunctional uterine bleeding)   . Elevated cholesterol   . Family history of cancer   . Fibroid   . GERD (gastroesophageal reflux disease)   . Hypertension   . Osteopenia   . Osteopenia   . Thyroid disease    Hyperthyroid    Past Surgical History:  Procedure Laterality Date  . OOPHORECTOMY  1998   RSO  . TUBAL LIGATION    . VAGINAL HYSTERECTOMY  1998   one ovary intact    Current Outpatient Medications  Medication Sig Dispense Refill  . atorvastatin (LIPITOR) 10 MG tablet Take 5 mg by mouth daily.    . Calcium  Carbonate-Vitamin D (CALCIUM + D PO) Take 1 tablet by mouth daily.     . cetirizine (ZYRTEC) 10 MG tablet Take 10 mg by mouth daily.    . fluticasone (FLONASE) 50 MCG/ACT nasal spray Place 1 spray into both nostrils daily as needed for allergies.   11  . hydrochlorothiazide (HYDRODIURIL) 25 MG tablet Take 25 mg by mouth daily.  11  . methimazole (TAPAZOLE) 10 MG tablet Take 5 mg by mouth daily.  3  . Multiple Vitamin (MULTIVITAMIN) tablet Take 1 tablet by mouth daily.    Marland Kitchen PROAIR HFA 108 (90 Base) MCG/ACT inhaler Inhale 2 puffs into the lungs every 6 (six) hours as needed.  1  . Ranitidine HCl (ZANTAC PO) Take 1 tablet by mouth daily.      No current facility-administered medications for this visit.    Family History  Problem Relation Age of Onset  . Heart disease Mother   . Hypertension Mother   . Ovarian cancer Mother   . Colon cancer Mother   . Skin cancer Mother   . Osteoporosis Mother   . Heart disease Brother   . Cancer Brother        Prostate cancer  . Melanoma Brother   . Skin cancer Brother   .  Breast cancer Maternal Aunt 80       deceased 43  . Cancer Brother        Prostate cancer  . Skin cancer Brother   . Leukemia Father   . Prostate cancer Father   . Skin cancer Father   . Skin cancer Sister   . Other Daughter        Mast Cell Disorder/Condition  . Hypoparathyroidism Daughter   . Thyroid disease Daughter   . Other Daughter        Mast Cell Disorder/Condition  . Thyroid disease Daughter   Mother had a BRCA-2 variant. Her mother had stage IV colon(70's) and stage III ovarian (70's), uterine cancer (40's) as well. 3 primaries.The patient saw a geneticist, moms BRCA 2 variant is of unknown significance, no extra testing or surgery was recommended, not felt to be a hereditary cancer.Geneticist didn't recommend removal of her remaining ovary.  Review of Systems  All other systems reviewed and are negative.   Exam:   LMP 09/04/1996   Weight change:  @WEIGHTCHANGE @ Height:      Ht Readings from Last 3 Encounters:  08/17/17 5' 3.5" (1.613 m)  05/17/16 5' 3.75" (1.619 m)  09/29/15 5' 4"  (1.626 m)    General appearance: alert, cooperative and appears stated age Head: Normocephalic, without obvious abnormality, atraumatic Neck: no adenopathy, supple, symmetrical, trachea midline and thyroid normal to inspection and palpation Lungs: clear to auscultation bilaterally Cardiovascular: regular rate and rhythm Breasts: normal appearance, no masses or tenderness Abdomen: soft, non-tender; non distended,  no masses,  no organomegaly Extremities: extremities normal, atraumatic, no cyanosis or edema Skin: Skin color, texture, turgor normal. No rashes or lesions Lymph nodes: Cervical, supraclavicular, and axillary nodes normal. No abnormal inguinal nodes palpated Neurologic: Grossly normal   Pelvic: External genitalia:  no lesions              Urethra:  normal appearing urethra with no masses, tenderness or lesions              Bartholins and Skenes: normal                 Vagina: normal appearing vagina with normal color and discharge, no lesions              Cervix: absent               Bimanual Exam:  Uterus:  uterus absent              Adnexa: no mass, fullness, tenderness               Rectovaginal: Confirms               Anus:  normal sphincter tone, no lesions  Karmen Bongo chaperoned for the exam.  A:  Well Woman with normal exam  H/O TVH/RSO  Family history of ovarian, colon and uterine cancer in her mother. Genetic testing only + for a variant of unknown significance. It was not recommended that the patient have genetic testing or removal of her remaining ovary  FH of melanoma, discussed skin checks. She doesn't have moles.   Osteopenia  Low libido, information given  P:   No pap needed  Colonoscopy q5 years, due in 2024  DEXA with primary  Labs with primary  Discussed breast self exam  Discussed calcium and vit D  intake  Will check a CA 125 (discussed risks/benifits), declines ultrasound unless the CA 125 is elevated.

## 2019-11-05 ENCOUNTER — Other Ambulatory Visit: Payer: Self-pay

## 2019-11-05 ENCOUNTER — Encounter: Payer: Self-pay | Admitting: Obstetrics and Gynecology

## 2019-11-05 ENCOUNTER — Ambulatory Visit (INDEPENDENT_AMBULATORY_CARE_PROVIDER_SITE_OTHER): Payer: BC Managed Care – PPO | Admitting: Obstetrics and Gynecology

## 2019-11-05 VITALS — BP 124/62 | HR 78 | Temp 97.9°F | Ht 63.5 in | Wt 161.0 lb

## 2019-11-05 DIAGNOSIS — Z808 Family history of malignant neoplasm of other organs or systems: Secondary | ICD-10-CM

## 2019-11-05 DIAGNOSIS — Z8041 Family history of malignant neoplasm of ovary: Secondary | ICD-10-CM

## 2019-11-05 DIAGNOSIS — Z01419 Encounter for gynecological examination (general) (routine) without abnormal findings: Secondary | ICD-10-CM

## 2019-11-05 DIAGNOSIS — Z800 Family history of malignant neoplasm of digestive organs: Secondary | ICD-10-CM | POA: Diagnosis not present

## 2019-11-05 DIAGNOSIS — Z8739 Personal history of other diseases of the musculoskeletal system and connective tissue: Secondary | ICD-10-CM

## 2019-11-05 NOTE — Patient Instructions (Signed)
EXERCISE AND DIET:  We recommended that you start or continue a regular exercise program for good health. Regular exercise means any activity that makes your heart beat faster and makes you sweat.  We recommend exercising at least 30 minutes per day at least 3 days a week, preferably 4 or 5.  We also recommend a diet low in fat and sugar.  Inactivity, poor dietary choices and obesity can cause diabetes, heart attack, stroke, and kidney damage, among others.    ALCOHOL AND SMOKING:  Women should limit their alcohol intake to no more than 7 drinks/beers/glasses of wine (combined, not each!) per week. Moderation of alcohol intake to this level decreases your risk of breast cancer and liver damage. And of course, no recreational drugs are part of a healthy lifestyle.  And absolutely no smoking or even second hand smoke. Most people know smoking can cause heart and lung diseases, but did you know it also contributes to weakening of your bones? Aging of your skin?  Yellowing of your teeth and nails?  CALCIUM AND VITAMIN D:  Adequate intake of calcium and Vitamin D are recommended.  The recommendations for exact amounts of these supplements seem to change often, but generally speaking 1,200 mg of calcium (between diet and supplement) and 800 units of Vitamin D per day seems prudent. Certain women may benefit from higher intake of Vitamin D.  If you are among these women, your doctor will have told you during your visit.    PAP SMEARS:  Pap smears, to check for cervical cancer or precancers,  have traditionally been done yearly, although recent scientific advances have shown that most women can have pap smears less often.  However, every woman still should have a physical exam from her gynecologist every year. It will include a breast check, inspection of the vulva and vagina to check for abnormal growths or skin changes, a visual exam of the cervix, and then an exam to evaluate the size and shape of the uterus and  ovaries.  And after 73 years of age, a rectal exam is indicated to check for rectal cancers. We will also provide age appropriate advice regarding health maintenance, like when you should have certain vaccines, screening for sexually transmitted diseases, bone density testing, colonoscopy, mammograms, etc.   MAMMOGRAMS:  All women over 40 years old should have a yearly mammogram. Many facilities now offer a "3D" mammogram, which may cost around $50 extra out of pocket. If possible,  we recommend you accept the option to have the 3D mammogram performed.  It both reduces the number of women who will be called back for extra views which then turn out to be normal, and it is better than the routine mammogram at detecting truly abnormal areas.    COLON CANCER SCREENING: Now recommend starting at age 45. At this time colonoscopy is not covered for routine screening until 50. There are take home tests that can be done between 45-49.   COLONOSCOPY:  Colonoscopy to screen for colon cancer is recommended for all women at age 50.  We know, you hate the idea of the prep.  We agree, BUT, having colon cancer and not knowing it is worse!!  Colon cancer so often starts as a polyp that can be seen and removed at colonscopy, which can quite literally save your life!  And if your first colonoscopy is normal and you have no family history of colon cancer, most women don't have to have it again for   10 years.  Once every ten years, you can do something that may end up saving your life, right?  We will be happy to help you get it scheduled when you are ready.  Be sure to check your insurance coverage so you understand how much it will cost.  It may be covered as a preventative service at no cost, but you should check your particular policy.      Breast Self-Awareness Breast self-awareness means being familiar with how your breasts look and feel. It involves checking your breasts regularly and reporting any changes to your  health care provider. Practicing breast self-awareness is important. A change in your breasts can be a sign of a serious medical problem. Being familiar with how your breasts look and feel allows you to find any problems early, when treatment is more likely to be successful. All women should practice breast self-awareness, including women who have had breast implants. How to do a breast self-exam One way to learn what is normal for your breasts and whether your breasts are changing is to do a breast self-exam. To do a breast self-exam: Look for Changes  1. Remove all the clothing above your waist. 2. Stand in front of a mirror in a room with good lighting. 3. Put your hands on your hips. 4. Push your hands firmly downward. 5. Compare your breasts in the mirror. Look for differences between them (asymmetry), such as: ? Differences in shape. ? Differences in size. ? Puckers, dips, and bumps in one breast and not the other. 6. Look at each breast for changes in your skin, such as: ? Redness. ? Scaly areas. 7. Look for changes in your nipples, such as: ? Discharge. ? Bleeding. ? Dimpling. ? Redness. ? A change in position. Feel for Changes Carefully feel your breasts for lumps and changes. It is best to do this while lying on your back on the floor and again while sitting or standing in the shower or tub with soapy water on your skin. Feel each breast in the following way:  Place the arm on the side of the breast you are examining above your head.  Feel your breast with the other hand.  Start in the nipple area and make  inch (2 cm) overlapping circles to feel your breast. Use the pads of your three middle fingers to do this. Apply light pressure, then medium pressure, then firm pressure. The light pressure will allow you to feel the tissue closest to the skin. The medium pressure will allow you to feel the tissue that is a little deeper. The firm pressure will allow you to feel the tissue  close to the ribs.  Continue the overlapping circles, moving downward over the breast until you feel your ribs below your breast.  Move one finger-width toward the center of the body. Continue to use the  inch (2 cm) overlapping circles to feel your breast as you move slowly up toward your collarbone.  Continue the up and down exam using all three pressures until you reach your armpit.  Write Down What You Find  Write down what is normal for each breast and any changes that you find. Keep a written record with breast changes or normal findings for each breast. By writing this information down, you do not need to depend only on memory for size, tenderness, or location. Write down where you are in your menstrual cycle, if you are still menstruating. If you are having trouble noticing differences   in your breasts, do not get discouraged. With time you will become more familiar with the variations in your breasts and more comfortable with the exam. How often should I examine my breasts? Examine your breasts every month. If you are breastfeeding, the best time to examine your breasts is after a feeding or after using a breast pump. If you menstruate, the best time to examine your breasts is 5-7 days after your period is over. During your period, your breasts are lumpier, and it may be more difficult to notice changes. When should I see my health care provider? See your health care provider if you notice:  A change in shape or size of your breasts or nipples.  A change in the skin of your breast or nipples, such as a reddened or scaly area.  Unusual discharge from your nipples.  A lump or thick area that was not there before.  Pain in your breasts.  Anything that concerns you.  

## 2019-11-06 LAB — CA 125: Cancer Antigen (CA) 125: 9.4 U/mL (ref 0.0–38.1)

## 2019-12-16 DIAGNOSIS — Z1231 Encounter for screening mammogram for malignant neoplasm of breast: Secondary | ICD-10-CM | POA: Diagnosis not present

## 2019-12-25 DIAGNOSIS — Z79899 Other long term (current) drug therapy: Secondary | ICD-10-CM | POA: Diagnosis not present

## 2019-12-25 DIAGNOSIS — E042 Nontoxic multinodular goiter: Secondary | ICD-10-CM | POA: Diagnosis not present

## 2019-12-25 DIAGNOSIS — E0590 Thyrotoxicosis, unspecified without thyrotoxic crisis or storm: Secondary | ICD-10-CM | POA: Diagnosis not present

## 2019-12-25 DIAGNOSIS — Z8349 Family history of other endocrine, nutritional and metabolic diseases: Secondary | ICD-10-CM | POA: Diagnosis not present

## 2020-01-07 ENCOUNTER — Other Ambulatory Visit: Payer: Self-pay | Admitting: Internal Medicine

## 2020-01-07 ENCOUNTER — Other Ambulatory Visit (HOSPITAL_COMMUNITY): Payer: Self-pay | Admitting: Internal Medicine

## 2020-01-07 DIAGNOSIS — E0590 Thyrotoxicosis, unspecified without thyrotoxic crisis or storm: Secondary | ICD-10-CM

## 2020-01-07 DIAGNOSIS — E042 Nontoxic multinodular goiter: Secondary | ICD-10-CM

## 2020-01-21 DIAGNOSIS — E042 Nontoxic multinodular goiter: Secondary | ICD-10-CM | POA: Diagnosis not present

## 2020-01-21 DIAGNOSIS — I10 Essential (primary) hypertension: Secondary | ICD-10-CM | POA: Diagnosis not present

## 2020-01-21 DIAGNOSIS — Z23 Encounter for immunization: Secondary | ICD-10-CM | POA: Diagnosis not present

## 2020-01-21 DIAGNOSIS — M542 Cervicalgia: Secondary | ICD-10-CM | POA: Diagnosis not present

## 2020-01-21 DIAGNOSIS — J4530 Mild persistent asthma, uncomplicated: Secondary | ICD-10-CM | POA: Diagnosis not present

## 2020-06-08 ENCOUNTER — Ambulatory Visit: Payer: Self-pay | Attending: Internal Medicine

## 2020-06-08 DIAGNOSIS — Z23 Encounter for immunization: Secondary | ICD-10-CM

## 2020-06-08 NOTE — Progress Notes (Signed)
   [facility]  Name:  Megan Huang    MRN: [id number] DOB: 11-Apr-1946  06/08/2020  Megan Huang was observed post Covid-19 immunization for 15 minutes without incident. She was provided with Vaccine Information Sheet and instruction to access the V-Safe system.   Megan Huang was instructed to call 911 with any severe reactions post vaccine: Marland Kitchen Difficulty breathing  . Swelling of face and throat  . A fast heartbeat  . A bad rash all over body  . Dizziness and weakness

## 2020-06-23 DIAGNOSIS — H35413 Lattice degeneration of retina, bilateral: Secondary | ICD-10-CM | POA: Diagnosis not present

## 2020-06-23 DIAGNOSIS — H40013 Open angle with borderline findings, low risk, bilateral: Secondary | ICD-10-CM | POA: Diagnosis not present

## 2020-06-23 DIAGNOSIS — H43813 Vitreous degeneration, bilateral: Secondary | ICD-10-CM | POA: Diagnosis not present

## 2020-06-23 DIAGNOSIS — H2513 Age-related nuclear cataract, bilateral: Secondary | ICD-10-CM | POA: Diagnosis not present

## 2020-07-12 DIAGNOSIS — Z20828 Contact with and (suspected) exposure to other viral communicable diseases: Secondary | ICD-10-CM | POA: Diagnosis not present

## 2020-09-27 DIAGNOSIS — Z20822 Contact with and (suspected) exposure to covid-19: Secondary | ICD-10-CM | POA: Diagnosis not present

## 2020-11-01 ENCOUNTER — Other Ambulatory Visit: Payer: Self-pay | Admitting: Internal Medicine

## 2020-11-01 DIAGNOSIS — E7800 Pure hypercholesterolemia, unspecified: Secondary | ICD-10-CM | POA: Diagnosis not present

## 2020-11-01 DIAGNOSIS — K224 Dyskinesia of esophagus: Secondary | ICD-10-CM | POA: Diagnosis not present

## 2020-11-01 DIAGNOSIS — R1011 Right upper quadrant pain: Secondary | ICD-10-CM | POA: Diagnosis not present

## 2020-11-01 DIAGNOSIS — E0590 Thyrotoxicosis, unspecified without thyrotoxic crisis or storm: Secondary | ICD-10-CM | POA: Diagnosis not present

## 2020-11-01 DIAGNOSIS — I10 Essential (primary) hypertension: Secondary | ICD-10-CM | POA: Diagnosis not present

## 2020-11-01 DIAGNOSIS — K219 Gastro-esophageal reflux disease without esophagitis: Secondary | ICD-10-CM | POA: Diagnosis not present

## 2020-11-01 DIAGNOSIS — Z0000 Encounter for general adult medical examination without abnormal findings: Secondary | ICD-10-CM | POA: Diagnosis not present

## 2020-11-02 ENCOUNTER — Ambulatory Visit
Admission: RE | Admit: 2020-11-02 | Discharge: 2020-11-02 | Disposition: A | Discharge status: Home / Self Care | Payer: BC Managed Care – PPO | Source: Ambulatory Visit | Attending: Internal Medicine | Admitting: Internal Medicine

## 2020-11-02 DIAGNOSIS — K8020 Calculus of gallbladder without cholecystitis without obstruction: Secondary | ICD-10-CM | POA: Diagnosis not present

## 2020-11-02 DIAGNOSIS — R1011 Right upper quadrant pain: Secondary | ICD-10-CM

## 2020-11-02 DIAGNOSIS — K760 Fatty (change of) liver, not elsewhere classified: Secondary | ICD-10-CM | POA: Diagnosis not present

## 2020-11-11 ENCOUNTER — Ambulatory Visit: Payer: Self-pay | Admitting: Obstetrics and Gynecology

## 2020-12-22 ENCOUNTER — Ambulatory Visit: Payer: Self-pay | Admitting: Surgery

## 2020-12-22 DIAGNOSIS — K8010 Calculus of gallbladder with chronic cholecystitis without obstruction: Secondary | ICD-10-CM | POA: Diagnosis not present

## 2020-12-28 DIAGNOSIS — Z1231 Encounter for screening mammogram for malignant neoplasm of breast: Secondary | ICD-10-CM | POA: Diagnosis not present

## 2021-01-18 ENCOUNTER — Ambulatory Visit: Payer: Self-pay | Admitting: Obstetrics and Gynecology

## 2021-03-01 DIAGNOSIS — H524 Presbyopia: Secondary | ICD-10-CM | POA: Diagnosis not present

## 2021-03-01 DIAGNOSIS — H2513 Age-related nuclear cataract, bilateral: Secondary | ICD-10-CM | POA: Diagnosis not present

## 2021-03-16 DIAGNOSIS — H25811 Combined forms of age-related cataract, right eye: Secondary | ICD-10-CM | POA: Diagnosis not present

## 2021-03-16 DIAGNOSIS — H2511 Age-related nuclear cataract, right eye: Secondary | ICD-10-CM | POA: Diagnosis not present

## 2021-03-24 ENCOUNTER — Ambulatory Visit: Payer: Self-pay | Admitting: Obstetrics and Gynecology

## 2021-04-07 ENCOUNTER — Ambulatory Visit: Payer: Self-pay | Admitting: Surgery

## 2021-04-07 NOTE — H&P (Signed)
General Surgery [facility]  Megan Huang DOB: [birth year] Married / Language: English / Race: White Female   History of Present Illness   The patient is a 75 year old female who presents for evaluation of gall stones.  CHIEF COMPLAINT: chronic cholecystitis, cholelithiasis  Patient is referred by Dr. Lavone Orn for surgical evaluation and management of chronic cholecystitis and cholelithiasis.  Patient has about a one-year history of intermittent abdominal pain associated with nausea and emesis.  This frequently occurs after meals.  Patient denies any history of jaundice or acholic stools.  She denies fevers or chills.  Episodes frequently occur after meals and lasts for a few hours.  Patient has had a prior bilateral tubal ligation and abdominal hysterectomy.  Otherwise she has had no additional abdominal surgery.  Patient underwent an abdominal ultrasound on November 02, 2020.  This demonstrated mobile gallstones.  There is no biliary dilatation.  There was diffuse hepatic steatosis.  Patient's husband has undergone an open cholecystectomy in the past.   Past Surgical History Hysterectomy (not due to cancer) - Partial    Diagnostic Studies History Colonoscopy   1-5 years ago Mammogram   1-3 years ago Pap Smear   >5 years ago  Allergies  Aspirin CR *ANALGESICS - NonNarcotic*   Analgesic *ANALGESICS - NonNarcotic*   Ibuprofen-Hydrocodone *ANALGESICS - OPIOID*   penicillAMINE *CHEMICALS*   Allergies Reconciled    Medication History  Albuterol Sulfate HFA  (108 (90 Base)MCG/ACT Aerosol Soln, Inhalation) Active. Arnuity Ellipta  (100MCG/ACT Aero Pow Br Act, Inhalation) Active. Atorvastatin Calcium  ('10MG'$  Tablet, Oral) Active. hydroCHLOROthiazide  ('25MG'$  Tablet, Oral) Active. ID Now [id number]  (In Vitro) Active. methIMAzole  ('10MG'$  Tablet, Oral) Active. Medications Reconciled   Social History Alcohol use   Moderate alcohol use. Illicit drug use   Remotely quit  drug use. Tobacco use   Never smoker.  Family History  Anesthetic complications   Mother. Bleeding disorder   Daughter. Cancer   Brother, Father. Cerebrovascular Accident   Daughter, Mother. Heart Disease   Mother. Heart disease in female family member before age 17   Heart disease in female family member before age 74   Hypertension   Mother. Melanoma   Brother. Ovarian Cancer   Mother. Prostate Cancer   Brother, Father. Thyroid problems   Daughter, Mother.  Pregnancy / Birth History  Age at menarche   4 years. Age of menopause   3-50  Other Problems  Arthritis   Cholelithiasis   Gastroesophageal Reflux Disease   Thyroid Disease    Review of Systems  General Present- Weight Gain. Not Present- Appetite Loss, Chills, Fatigue, Fever, Night Sweats and Weight Loss. Skin Not Present- Change in Wart/Mole, Dryness, Hives, Jaundice, New Lesions, Non-Healing Wounds, Rash and Ulcer. HEENT Present- Seasonal Allergies and Wears glasses/contact lenses. Not Present- Earache, Hearing Loss, Hoarseness, Nose Bleed, Oral Ulcers, Ringing in the Ears, Sinus Pain, Sore Throat, Visual Disturbances and Yellow Eyes. Respiratory Present- Difficulty Breathing. Not Present- Bloody sputum, Chronic Cough, Snoring and Wheezing. Breast Not Present- Breast Mass, Breast Pain, Nipple Discharge and Skin Changes. Cardiovascular Present- Swelling of Extremities. Not Present- Chest Pain, Difficulty Breathing Lying Down, Leg Cramps, Palpitations, Rapid Heart Rate and Shortness of Breath. Gastrointestinal Present- Abdominal Pain and Hemorrhoids. Not Present- Bloating, Bloody Stool, Change in Bowel Habits, Chronic diarrhea, Constipation, Difficulty Swallowing, Excessive gas, Gets full quickly at meals, Indigestion, Nausea, Rectal Pain and Vomiting. Female Genitourinary Not Present- Frequency, Nocturia, Painful Urination, Pelvic Pain and Urgency.  Musculoskeletal Present- Back Pain and Joint Pain. Not Present- Joint  Stiffness, Muscle Pain, Muscle Weakness and Swelling of Extremities. Neurological Present- Headaches. Not Present- Decreased Memory, Fainting, Numbness, Seizures, Tingling, Tremor, Trouble walking and Weakness. Psychiatric Not Present- Anxiety, Bipolar, Change in Sleep Pattern, Depression, Fearful and Frequent crying. Endocrine Not Present- Cold Intolerance, Excessive Hunger, Hair Changes, Heat Intolerance, Hot flashes and New Diabetes. Hematology Not Present- Blood Thinners, Easy Bruising, Excessive bleeding, Gland problems, HIV and Persistent Infections.  Vitals  Weight: 164.25 lb   Height: 64 in  Body Surface Area: 1.8 m   Body Mass Index: 28.19 kg/m   Temp.: 97.4 F    Pulse: 87 (Regular)    P.OX: 98% (Room air) BP: 140/70(Sitting, Left Arm, Standard)  Physical Exam   GENERAL APPEARANCE Development: normal Nutritional status: normal Gross deformities: none  SKIN Rash, lesions, ulcers: none Induration, erythema: none Nodules: none palpable  EYES Conjunctiva and lids: normal Pupils: equal and reactive Iris: normal bilaterally  EARS, NOSE, MOUTH, THROAT External ears: no lesion or deformity External nose: no lesion or deformity Hearing: grossly normal Due to Covid-19 pandemic, patient is wearing a mask.  NECK Symmetric: yes Trachea: midline Thyroid: no palpable nodules in the thyroid bed  CHEST Respiratory effort: normal Retraction or accessory muscle use: no Breath sounds: normal bilaterally Rales, rhonchi, wheeze: none  CARDIOVASCULAR Auscultation: regular rhythm, normal rate Murmurs: none Pulses: radial pulse 2+ palpable Lower extremity edema: none  ABDOMEN Distension: none Masses: none palpable Tenderness: none Hepatosplenomegaly: not present Hernia: not present  MUSCULOSKELETAL Station and gait: normal Digits and nails: no clubbing or cyanosis Muscle strength: grossly normal all extremities Range of motion: grossly normal all  extremities Deformity: none  LYMPHATIC Cervical: none palpable Supraclavicular: none palpable  PSYCHIATRIC Oriented to person, place, and time: yes Mood and affect: normal for situation Judgment and insight: appropriate for situation    Assessment & Plan   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  Patient is provided with written literature on laparoscopic gallbladder surgery to review at home.  We discussed her symptoms.  We reviewed the anatomy and discussed the significance of gallstones.  We discussed the risk and benefits of surgery including the risk of common bile duct injury and the potential need for conversion to open surgery.  We discussed the hospital stay to be anticipated.  We discussed her postoperative recovery.  We discussed potential complications if the patient did not undergo cholecystectomy.  After careful consideration, the patient would like to proceed with surgery in the near future.  We will make arrangements at a time convenient for the patient.  The risks and benefits of the procedure have been discussed at length with the patient.  The patient understands the proposed procedure, potential alternative treatments, and the course of recovery to be expected.  All of the patient's questions have been answered at this time.  The patient wishes to proceed with surgery.  Armandina Gemma, MD [facility] [place] practice Office: [phone]

## 2021-04-07 NOTE — Progress Notes (Signed)
Please enter orders for surgery 04-28-21 and PAT 04-12-21

## 2021-04-11 NOTE — Progress Notes (Signed)
DUE TO COVID-19 ONLY ONE VISITOR IS ALLOWED TO COME WITH YOU AND STAY IN THE WAITING ROOM ONLY DURING PRE OP AND PROCEDURE DAY OF SURGERY.  2 VISITOR  MAY VISIT WITH YOU AFTER SURGERY IN YOUR PRIVATE ROOM DURING VISITING HOURS ONLY!  YOU NEED TO HAVE A COVID 19 TEST ON___8/22/2022 ___'@_'$  '@_from'$  8am-3pm _____, THIS TEST MUST BE DONE BEFORE SURGERY,  Covid test is done at [place], [place] [address].  This is a drive thru.  No appt required. Please see map.                 Your procedure is scheduled on:    04/28/2021   Report to [facility] Main  Entrance   Report to admitting at     [phone]     Call this number if you have problems the morning of surgery [phone]    REMEMBER: NO  SOLID FOOD CANDY OR GUM AFTER MIDNIGHT. CLEAR LIQUIDS UNTIL  0430am        . NOTHING BY MOUTH EXCEPT CLEAR LIQUIDS UNTIL   0430am    . PLEASE FINISH ENSURE DRINK PER SURGEON ORDER  WHICH NEEDS TO BE COMPLETED AT  0430am     .      CLEAR LIQUID DIET   Foods Allowed                                                                    Coffee and tea, regular and decaf                            Fruit ices (not with fruit pulp)                                      Iced Popsicles                                    Carbonated beverages, regular and diet                                    Cranberry, grape and apple juices Sports drinks like Gatorade Lightly seasoned clear broth or consume(fat free) Sugar, honey syrup ___________________________________________________________________      BRUSH YOUR TEETH MORNING OF SURGERY AND RINSE YOUR MOUTH OUT, NO CHEWING GUM CANDY OR MINTS.     Take these medicines the morning of surgery with A SIP OF WATER:    Zyrtec, pepcid, omhjhalers as usual and bring, metoprolol, tapazole  DO NOT TAKE ANY DIABETIC MEDICATIONS DAY OF YOUR SURGERY                               You may not have any metal on your body including hair pins and               piercings  Do not wear jewelry, make-up, lotions, powders or perfumes, deodorant  Do not wear nail polish on your fingernails.  Do not shave  48 hours prior to surgery.              Men may shave face and neck.   Do not bring valuables to the hospital. [place].  Contacts, dentures or bridgework may not be worn into surgery.  Leave suitcase in the car. After surgery it may be brought to your room.     Patients discharged the day of surgery will not be allowed to drive home. IF YOU ARE HAVING SURGERY AND GOING HOME THE SAME DAY, YOU MUST HAVE AN ADULT TO DRIVE YOU HOME AND BE WITH YOU FOR 24 HOURS. YOU MAY GO HOME BY TAXI OR UBER OR ORTHERWISE, BUT AN ADULT MUST ACCOMPANY YOU HOME AND STAY WITH YOU FOR 24 HOURS.  Name and phone number of your driver:  Special Instructions: N/A              Please read over the following fact sheets you were given: _____________________________________________________________________  [facility] - Preparing for Surgery Before surgery, you can play an important role.  Because skin is not sterile, your skin needs to be as free of germs as possible.  You can reduce the number of germs on your skin by washing with CHG (chlorahexidine gluconate) soap before surgery.  CHG is an antiseptic cleaner which kills germs and bonds with the skin to continue killing germs even after washing. Please DO NOT use if you have an allergy to CHG or antibacterial soaps.  If your skin becomes reddened/irritated stop using the CHG and inform your nurse when you arrive at Short Stay. Do not shave (including legs and underarms) for at least 48 hours prior to the first CHG shower.  You may shave your face/neck. Please follow these instructions carefully:  1.  Shower with CHG Soap the night before surgery and the  morning of Surgery.  2.  If you choose to wash your hair, wash your hair first as usual with your  normal   shampoo.  3.  After you shampoo, rinse your hair and body thoroughly to remove the  shampoo.                           4.  Use CHG as you would any other liquid soap.  You can apply chg directly  to the skin and wash                       Gently with a scrungie or clean washcloth.  5.  Apply the CHG Soap to your body ONLY FROM THE NECK DOWN.   Do not use on face/ open                           Wound or open sores. Avoid contact with eyes, ears mouth and genitals (private parts).                       Wash face,  Genitals (private parts) with your normal soap.             6.  Wash thoroughly, paying special attention to the area where your surgery  will be performed.  7.  Thoroughly rinse your body with  warm water from the neck down.  8.  DO NOT shower/wash with your normal soap after using and rinsing off  the CHG Soap.                9.  Pat yourself dry with a clean towel.            10.  Wear clean pajamas.            11.  Place clean sheets on your bed the night of your first shower and do not  sleep with pets. Day of Surgery : Do not apply any lotions/deodorants the morning of surgery.  Please wear clean clothes to the hospital/surgery center.  FAILURE TO FOLLOW THESE INSTRUCTIONS MAY RESULT IN THE CANCELLATION OF YOUR SURGERY PATIENT SIGNATURE_________________________________  NURSE SIGNATURE__________________________________  ________________________________________________________________________

## 2021-04-12 ENCOUNTER — Encounter (HOSPITAL_COMMUNITY)
Admission: RE | Admit: 2021-04-12 | Discharge: 2021-04-12 | Disposition: A | Discharge status: Home / Self Care | Payer: BC Managed Care – PPO | Source: Ambulatory Visit | Attending: Surgery | Admitting: Surgery

## 2021-04-12 ENCOUNTER — Encounter (HOSPITAL_COMMUNITY): Payer: Self-pay

## 2021-04-12 ENCOUNTER — Other Ambulatory Visit: Payer: Self-pay

## 2021-04-12 DIAGNOSIS — Z01818 Encounter for other preprocedural examination: Secondary | ICD-10-CM | POA: Diagnosis not present

## 2021-04-12 HISTORY — DX: Unspecified asthma, uncomplicated: J45.909

## 2021-04-12 HISTORY — DX: Von Willebrand disease, unspecified: D68.00

## 2021-04-12 HISTORY — DX: Headache, unspecified: R51.9

## 2021-04-12 HISTORY — DX: Nausea with vomiting, unspecified: R11.2

## 2021-04-12 HISTORY — DX: Family history of other specified conditions: Z84.89

## 2021-04-12 HISTORY — DX: Unspecified osteoarthritis, unspecified site: M19.90

## 2021-04-12 HISTORY — DX: Nausea with vomiting, unspecified: Z98.890

## 2021-04-12 HISTORY — DX: Von Willebrand disease: D68.0

## 2021-04-12 LAB — CBC
HCT: 42.1 % (ref 36.0–46.0)
Hemoglobin: 14.4 g/dL (ref 12.0–15.0)
MCH: 29.4 pg (ref 26.0–34.0)
MCHC: 34.2 g/dL (ref 30.0–36.0)
MCV: 85.9 fL (ref 80.0–100.0)
Platelets: 185 10*3/uL (ref 150–400)
RBC: 4.9 MIL/uL (ref 3.87–5.11)
RDW: 12.3 % (ref 11.5–15.5)
WBC: 6.8 10*3/uL (ref 4.0–10.5)
nRBC: 0 % (ref 0.0–0.2)

## 2021-04-12 LAB — BASIC METABOLIC PANEL
Anion gap: 7 (ref 5–15)
BUN: 15 mg/dL (ref 8–23)
CO2: 27 mmol/L (ref 22–32)
Calcium: 9.5 mg/dL (ref 8.9–10.3)
Chloride: 101 mmol/L (ref 98–111)
Creatinine, Ser: 0.83 mg/dL (ref 0.44–1.00)
GFR, Estimated: >60 mL/min (ref >60)
Glucose, Bld: 114 mg/dL — ABNORMAL HIGH (ref 70–99)
Potassium: 4 mmol/L (ref 3.5–5.1)
Sodium: 135 mmol/L (ref 135–145)

## 2021-04-12 NOTE — Progress Notes (Addendum)
Anesthesia Review:  PCP: DR Lavone Orn Requested LOV note.  They are to fax. 11/01/20 on chart  Cardiologist : none  Chest x-ray : EKG : 04/12/21  Echo : Stress test: 2017  Cardiac Cath :  Activity level: can do a flight of stairs without difficulty  Sleep Study/ CPAP : none  Fasting Blood Sugar :      / Checks Blood Sugar -- times a day:   Blood Thinner/ Instructions /Last Dose: ASA / Instructions/ Last Dose :   PT only takes Metoprolol as needed for palpitations.

## 2021-04-17 ENCOUNTER — Encounter (HOSPITAL_COMMUNITY): Payer: Self-pay | Admitting: Surgery

## 2021-04-17 DIAGNOSIS — K8010 Calculus of gallbladder with chronic cholecystitis without obstruction: Secondary | ICD-10-CM

## 2021-04-17 NOTE — H&P (Signed)
Megan Huang DOB: [birth year] Married / Language: English / Race: White Female   History of Present Illness  The patient is a 75 year old female who presents for evaluation of gall stones.  CHIEF COMPLAINT: chronic cholecystitis, cholelithiasis  Patient is referred by Dr. Lavone Orn for surgical evaluation and management of chronic cholecystitis and cholelithiasis.  Patient has about a one-year history of intermittent abdominal pain associated with nausea and emesis.  This frequently occurs after meals.  Patient denies any history of jaundice or acholic stools.  She denies fevers or chills.  Episodes frequently occur after meals and lasts for a few hours.  Patient has had a prior bilateral tubal ligation and abdominal hysterectomy.  Otherwise she has had no additional abdominal surgery.  Patient underwent an abdominal ultrasound on November 02, 2020.  This demonstrated mobile gallstones.  There is no biliary dilatation.  There was diffuse hepatic steatosis.  Patient's husband has undergone an open cholecystectomy in the past.   Past Surgical History Hysterectomy (not due to cancer) - Partial    Diagnostic Studies History  Colonoscopy   1-5 years ago Mammogram   1-3 years ago Pap Smear   >5 years ago  Allergies  Aspirin CR *ANALGESICS - NonNarcotic*   Analgesic *ANALGESICS - NonNarcotic*   Ibuprofen-Hydrocodone *ANALGESICS - OPIOID*   penicillAMINE *CHEMICALS*   Allergies Reconciled    Medication History  Albuterol Sulfate HFA  (108 (90 Base)MCG/ACT Aerosol Soln, Inhalation) Active. Arnuity Ellipta  (100MCG/ACT Aero Pow Br Act, Inhalation) Active. Atorvastatin Calcium  ('10MG'$  Tablet, Oral) Active. hydroCHLOROthiazide  ('25MG'$  Tablet, Oral) Active. ID Now [id number]  (In Vitro) Active. methIMAzole  ('10MG'$  Tablet, Oral) Active. Medications Reconciled   Social History Alcohol use   Moderate alcohol use. Illicit drug use   Remotely quit drug use. Tobacco use   Never  smoker.  Family History Anesthetic complications   Mother. Bleeding disorder   Daughter. Cancer   Brother, Father. Cerebrovascular Accident   Daughter, Mother. Heart Disease   Mother. Heart disease in female family member before age 29   Heart disease in female family member before age 45   Hypertension   Mother. Melanoma   Brother. Ovarian Cancer   Mother. Prostate Cancer   Brother, Father. Thyroid problems   Daughter, Mother.  Pregnancy / Birth History  Age at menarche   3 years. Age of menopause   82-50  Other Problems  Arthritis   Cholelithiasis   Gastroesophageal Reflux Disease   Thyroid Disease    Review of Systems  General Present- Weight Gain. Not Present- Appetite Loss, Chills, Fatigue, Fever, Night Sweats and Weight Loss. Skin Not Present- Change in Wart/Mole, Dryness, Hives, Jaundice, New Lesions, Non-Healing Wounds, Rash and Ulcer. HEENT Present- Seasonal Allergies and Wears glasses/contact lenses. Not Present- Earache, Hearing Loss, Hoarseness, Nose Bleed, Oral Ulcers, Ringing in the Ears, Sinus Pain, Sore Throat, Visual Disturbances and Yellow Eyes. Respiratory Present- Difficulty Breathing. Not Present- Bloody sputum, Chronic Cough, Snoring and Wheezing. Breast Not Present- Breast Mass, Breast Pain, Nipple Discharge and Skin Changes. Cardiovascular Present- Swelling of Extremities. Not Present- Chest Pain, Difficulty Breathing Lying Down, Leg Cramps, Palpitations, Rapid Heart Rate and Shortness of Breath. Gastrointestinal Present- Abdominal Pain and Hemorrhoids. Not Present- Bloating, Bloody Stool, Change in Bowel Habits, Chronic diarrhea, Constipation, Difficulty Swallowing, Excessive gas, Gets full quickly at meals, Indigestion, Nausea, Rectal Pain and Vomiting. Female Genitourinary Not Present- Frequency, Nocturia, Painful Urination, Pelvic Pain and Urgency. Musculoskeletal Present- Back Pain and Joint  Pain. Not Present- Joint Stiffness, Muscle Pain, Muscle  Weakness and Swelling of Extremities. Neurological Present- Headaches. Not Present- Decreased Memory, Fainting, Numbness, Seizures, Tingling, Tremor, Trouble walking and Weakness. Psychiatric Not Present- Anxiety, Bipolar, Change in Sleep Pattern, Depression, Fearful and Frequent crying. Endocrine Not Present- Cold Intolerance, Excessive Hunger, Hair Changes, Heat Intolerance, Hot flashes and New Diabetes. Hematology Not Present- Blood Thinners, Easy Bruising, Excessive bleeding, Gland problems, HIV and Persistent Infections.  Vitals  Weight: 164.25 lb   Height: 64 in  Body Surface Area: 1.8 m   Body Mass Index: 28.19 kg/m   Temp.: 97.4 F    Pulse: 87 (Regular)    P.OX: 98% (Room air) BP: 140/70(Sitting, Left Arm, Standard)  Physical Exam   GENERAL APPEARANCE Development: normal Nutritional status: normal Gross deformities: none  SKIN Rash, lesions, ulcers: none Induration, erythema: none Nodules: none palpable  EYES Conjunctiva and lids: normal Pupils: equal and reactive Iris: normal bilaterally  EARS, NOSE, MOUTH, THROAT External ears: no lesion or deformity External nose: no lesion or deformity Hearing: grossly normal Due to Covid-19 pandemic, patient is wearing a mask.  NECK Symmetric: yes Trachea: midline Thyroid: no palpable nodules in the thyroid bed  CHEST Respiratory effort: normal Retraction or accessory muscle use: no Breath sounds: normal bilaterally Rales, rhonchi, wheeze: none  CARDIOVASCULAR Auscultation: regular rhythm, normal rate Murmurs: none Pulses: radial pulse 2+ palpable Lower extremity edema: none  ABDOMEN Distension: none Masses: none palpable Tenderness: none Hepatosplenomegaly: not present Hernia: not present  MUSCULOSKELETAL Station and gait: normal Digits and nails: no clubbing or cyanosis Muscle strength: grossly normal all extremities Range of motion: grossly normal all extremities Deformity:  none  LYMPHATIC Cervical: none palpable Supraclavicular: none palpable  PSYCHIATRIC Oriented to person, place, and time: yes Mood and affect: normal for situation Judgment and insight: appropriate for situation    Assessment & Plan   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  Patient is provided with written literature on laparoscopic gallbladder surgery to review at home.  We discussed her symptoms.  We reviewed the anatomy and discussed the significance of gallstones.  We discussed the risk and benefits of surgery including the risk of common bile duct injury and the potential need for conversion to open surgery.  We discussed the hospital stay to be anticipated.  We discussed her postoperative recovery.  We discussed potential complications if the patient did not undergo cholecystectomy.  After careful consideration, the patient would like to proceed with surgery in the near future.  We will make arrangements at a time convenient for the patient.  The risks and benefits of the procedure have been discussed at length with the patient.  The patient understands the proposed procedure, potential alternative treatments, and the course of recovery to be expected.  All of the patient's questions have been answered at this time.  The patient wishes to proceed with surgery.  Armandina Gemma, MD [facility] A [place] practice Office: [phone]

## 2021-04-18 NOTE — Progress Notes (Signed)
75 y.o. [id number] Married White or Caucasian Not Hispanic or Latino female here for annual exam.  H/O TVH/RSO. Sexually active, no pain with lubrication.   No bowel or bladder changes. She has mild GSI with valsalva, her sense to void has decreased some.   She c/o new hair growth on her face. It's bothering her.   Getting her gallbladder out on Monday.   She would like to move near her daughter in [place].     Patient's last menstrual period was 09/04/1996.          Sexually active: Yes.    The current method of family planning is none.    Exercising: Yes.     Smoker:  no  Health Maintenance: Pap:  12-07-10 normal History of abnormal Pap:  no MMG:  03-2021 normal [facility] BMD: Osteopenia, will do with her primary Colonoscopy: 2019 normal TDaP:  UTD Gardasil: N/A   reports that she has quit smoking. She has never used smokeless tobacco. She reports current alcohol use of about 5.0 standard drinks per week. She reports that she does not use drugs. She is a retired Chief Executive Officer. Husband is 41 and still working as an Dietitian (plans retirement in one year). She has 2 daughters, 4 grandchildren (one is a girl). One daughter is a a Air cabin crew (kids are 6 & 20) the other is a Marketing executive (son is 65, stepson is 86). Mother is 5, in [place].  Past Medical History:  Diagnosis Date   Arthritis    Asthma    DUB (dysfunctional uterine bleeding)    Elevated cholesterol    Family history of adverse reaction to anesthesia    mother woke up during anesthesia and hallucinations afterwards   Family history of cancer    Fibroid    GERD (gastroesophageal reflux disease)    Headache    Hypertension    Osteopenia    Osteopenia    PONV (postoperative nausea and vomiting)    Thyroid disease    Hyperthyroid   Von Willebrand disease ([place])    hx of per pt    Past Surgical History:  Procedure Laterality Date   cataract surgery      MOUTH SURGERY     OOPHORECTOMY  09/04/1996   RSO    TUBAL LIGATION     VAGINAL HYSTERECTOMY  09/04/1996   one ovary intact    Current Outpatient Medications  Medication Sig Dispense Refill   atorvastatin (LIPITOR) 10 MG tablet Take 5 mg by mouth daily.     Calcium Carbonate-Vitamin D (CALCIUM + D PO) Take 1 tablet by mouth in the morning and at bedtime.     cetirizine (ZYRTEC) 10 MG tablet Take 10 mg by mouth daily.     famotidine (PEPCID) 20 MG tablet Take 20 mg by mouth daily.     fluticasone (FLONASE) 50 MCG/ACT nasal spray Place 1 spray into both nostrils daily as needed for allergies.   11   Fluticasone Furoate (ARNUITY ELLIPTA) 100 MCG/ACT AEPB Inhale 1 puff into the lungs daily.     hydrochlorothiazide (HYDRODIURIL) 25 MG tablet Take 25 mg by mouth daily.  11   methimazole (TAPAZOLE) 10 MG tablet Take 5 mg by mouth daily.  3   metoprolol tartrate (LOPRESSOR) 25 MG tablet Take 12.5 mg by mouth 2 (two) times daily as needed (Heart rhythem).     Multiple Vitamin (MULTIVITAMIN) tablet Take 1 tablet by mouth daily.     Polyethyl Glycol-Propyl Glycol (SYSTANE OP)  Place 1 drop into both eyes in the morning and at bedtime.     PROAIR HFA 108 (90 Base) MCG/ACT inhaler Inhale 2 puffs into the lungs every 6 (six) hours as needed for wheezing or shortness of breath.  1   nitroGLYCERIN (NITROSTAT) 0.4 MG SL tablet Place 0.4 mg under the tongue every 5 (five) minutes as needed for chest pain. (Patient not taking: Reported on 04/22/2021)     No current facility-administered medications for this visit.    Family History  Problem Relation Age of Onset   Heart disease Mother    Hypertension Mother    Ovarian cancer Mother    Colon cancer Mother    Skin cancer Mother    Osteoporosis Mother    Heart disease Brother    Cancer Brother        Prostate cancer   Melanoma Brother    Skin cancer Brother    Breast cancer Maternal Aunt 73       deceased 1   Cancer Brother        Prostate cancer   Skin cancer Brother    Leukemia Father    Prostate  cancer Father    Skin cancer Father    Skin cancer Sister    Other Daughter        Mast Cell Disorder/Condition   Hypoparathyroidism Daughter    Thyroid disease Daughter    Other Daughter        Mast Cell Disorder/Condition   Thyroid disease Daughter   Mother had a BRCA-2 variant.  Her mother had stage IV colon(70's) and stage III ovarian (70's), uterine cancer (40's) as well.  3 primaries. The patient saw a geneticist, moms BRCA 2 variant is of unknown significance, no extra testing or surgery was recommended, not felt to be a hereditary cancer. Geneticist didn't recommend removal of her remaining ovary.   Review of Systems  All other systems reviewed and are negative.  Exam:   BP 122/74   Pulse 78   Ht 5' 3" (1.6 m)   Wt 164 lb (74.4 kg)   LMP 09/04/1996   SpO2 98%   BMI 29.05 kg/m   Weight change: _0 @ Height:   Height: 5' 3" (160 cm)  Ht Readings from Last 3 Encounters:  04/22/21 5' 3" (1.6 m)  11/05/19 5' 3.5" (1.613 m)  08/17/17 5' 3.5" (1.613 m)    General appearance: alert, cooperative and appears stated age Head: Normocephalic, without obvious abnormality, atraumatic Neck: no adenopathy, supple, symmetrical, trachea midline and thyroid normal to inspection and palpation Lungs: clear to auscultation bilaterally Cardiovascular: regular rate and rhythm Breasts: normal appearance, no masses or tenderness Abdomen: soft, non-tender; non distended,  no masses,  no organomegaly Extremities: extremities normal, atraumatic, no cyanosis or edema Skin: Skin color, texture, turgor normal. No rashes or lesions Lymph nodes: Cervical, supraclavicular, and axillary nodes normal. No abnormal inguinal nodes palpated Neurologic: Grossly normal   Pelvic: External genitalia:  no lesions              Urethra:  normal appearing urethra with no masses, tenderness or lesions              Bartholins and Skenes: normal                 Vagina: normal appearing vagina with normal  color and discharge, no lesions              Cervix: absent  Bimanual Exam:  Uterus:  uterus absent              Adnexa: no mass, fullness, tenderness               Rectovaginal: Confirms               Anus:  normal sphincter tone, no lesions  Caryn Bee, CMA chaperoned for the exam.  1. Well woman exam Normal exam No pap needed Mammogram recently done at [facility]  2. Family history of ovarian cancer Desires yearly CA 125 - CA 125  3. Family history of colon cancer Colonoscopy UTD  4. Family history of melanoma  5. Hirsutism - Testosterone, Total, LC/MS/MS - 17-Hydroxyprogesterone

## 2021-04-19 DIAGNOSIS — H5212 Myopia, left eye: Secondary | ICD-10-CM | POA: Diagnosis not present

## 2021-04-19 DIAGNOSIS — Z961 Presence of intraocular lens: Secondary | ICD-10-CM | POA: Diagnosis not present

## 2021-04-20 ENCOUNTER — Encounter (HOSPITAL_COMMUNITY): Payer: Self-pay | Admitting: Surgery

## 2021-04-20 NOTE — Progress Notes (Signed)
Spoke with Megan Huang she is aware to arrive at 7:30AM 04/25/2021. She is also aware to get COVID tested on 04/21/2021. Reviewed instructions verbalized understanding. No changes in allergies, medical history, or medications since her pre op appointment.

## 2021-04-21 ENCOUNTER — Other Ambulatory Visit: Payer: Self-pay | Admitting: Surgery

## 2021-04-21 LAB — SARS CORONAVIRUS 2 (TAT 6-24 HRS): SARS Coronavirus 2: NEGATIVE

## 2021-04-22 ENCOUNTER — Ambulatory Visit (INDEPENDENT_AMBULATORY_CARE_PROVIDER_SITE_OTHER): Payer: BC Managed Care – PPO | Admitting: Obstetrics and Gynecology

## 2021-04-22 ENCOUNTER — Encounter: Payer: Self-pay | Admitting: Obstetrics and Gynecology

## 2021-04-22 ENCOUNTER — Other Ambulatory Visit: Payer: Self-pay

## 2021-04-22 VITALS — BP 122/74 | HR 78 | Ht 63.0 in | Wt 164.0 lb

## 2021-04-22 DIAGNOSIS — L680 Hirsutism: Secondary | ICD-10-CM

## 2021-04-22 DIAGNOSIS — Z8041 Family history of malignant neoplasm of ovary: Secondary | ICD-10-CM | POA: Diagnosis not present

## 2021-04-22 DIAGNOSIS — Z800 Family history of malignant neoplasm of digestive organs: Secondary | ICD-10-CM

## 2021-04-22 DIAGNOSIS — Z01419 Encounter for gynecological examination (general) (routine) without abnormal findings: Secondary | ICD-10-CM

## 2021-04-22 DIAGNOSIS — Z808 Family history of malignant neoplasm of other organs or systems: Secondary | ICD-10-CM

## 2021-04-22 NOTE — Patient Instructions (Signed)

## 2021-04-24 NOTE — Anesthesia Preprocedure Evaluation (Addendum)
Anesthesia Evaluation  Patient identified by MRN, date of birth, ID band Patient awake    Reviewed: Allergy & Precautions, NPO status , Patient's Chart, lab work & pertinent test results  History of Anesthesia Complications (+) PONV  Airway Mallampati: II  TM Distance: >3 FB Neck ROM: Full    Dental no notable dental hx. (+) Teeth Intact, Dental Advisory Given   Pulmonary asthma , former smoker,    Pulmonary exam normal breath sounds clear to auscultation       Cardiovascular hypertension, Normal cardiovascular exam Rhythm:Regular Rate:Normal     Neuro/Psych    GI/Hepatic Neg liver ROS, GERD  ,  Endo/Other  negative endocrine ROS  Renal/GU      Musculoskeletal  (+) Arthritis ,   Abdominal   Peds  Hematology   Anesthesia Other Findings See list  Reproductive/Obstetrics                            Anesthesia Physical Anesthesia Plan  ASA: 2  Anesthesia Plan: General   Post-op Pain Management:    Induction: Intravenous  PONV Risk Score and Plan: 4 or greater and Treatment may vary due to age or medical condition and Ondansetron  Airway Management Planned: Oral ETT  Additional Equipment: None  Intra-op Plan:   Post-operative Plan: Extubation in OR  Informed Consent: I have reviewed the patients History and Physical, chart, labs and discussed the procedure including the risks, benefits and alternatives for the proposed anesthesia with the patient or authorized representative who has indicated his/her understanding and acceptance.     Dental advisory given  Plan Discussed with: CRNA and Anesthesiologist  Anesthesia Plan Comments:        Anesthesia Quick Evaluation

## 2021-04-25 ENCOUNTER — Encounter (HOSPITAL_COMMUNITY): Payer: Self-pay | Admitting: Surgery

## 2021-04-25 ENCOUNTER — Encounter (HOSPITAL_COMMUNITY)
Admission: RE | Disposition: A | Discharge status: Home / Self Care | Payer: Self-pay | Source: Ambulatory Visit | Attending: Surgery

## 2021-04-25 ENCOUNTER — Ambulatory Visit (HOSPITAL_COMMUNITY): Payer: BC Managed Care – PPO

## 2021-04-25 ENCOUNTER — Ambulatory Visit (HOSPITAL_COMMUNITY)
Admission: RE | Admit: 2021-04-25 | Discharge: 2021-04-25 | Disposition: A | Discharge status: Home / Self Care | Payer: BC Managed Care – PPO | Source: Ambulatory Visit | Attending: Surgery | Admitting: Surgery

## 2021-04-25 ENCOUNTER — Other Ambulatory Visit: Payer: Self-pay

## 2021-04-25 ENCOUNTER — Ambulatory Visit (HOSPITAL_COMMUNITY): Payer: BC Managed Care – PPO | Admitting: Physician Assistant

## 2021-04-25 ENCOUNTER — Ambulatory Visit (HOSPITAL_COMMUNITY): Payer: BC Managed Care – PPO | Admitting: Anesthesiology

## 2021-04-25 DIAGNOSIS — E7800 Pure hypercholesterolemia, unspecified: Secondary | ICD-10-CM | POA: Diagnosis not present

## 2021-04-25 DIAGNOSIS — Z8349 Family history of other endocrine, nutritional and metabolic diseases: Secondary | ICD-10-CM | POA: Diagnosis not present

## 2021-04-25 DIAGNOSIS — Z886 Allergy status to analgesic agent status: Secondary | ICD-10-CM | POA: Insufficient documentation

## 2021-04-25 DIAGNOSIS — Z419 Encounter for procedure for purposes other than remedying health state, unspecified: Secondary | ICD-10-CM

## 2021-04-25 DIAGNOSIS — Z832 Family history of diseases of the blood and blood-forming organs and certain disorders involving the immune mechanism: Secondary | ICD-10-CM | POA: Insufficient documentation

## 2021-04-25 DIAGNOSIS — K828 Other specified diseases of gallbladder: Secondary | ICD-10-CM | POA: Diagnosis not present

## 2021-04-25 DIAGNOSIS — Z8041 Family history of malignant neoplasm of ovary: Secondary | ICD-10-CM | POA: Insufficient documentation

## 2021-04-25 DIAGNOSIS — Z8249 Family history of ischemic heart disease and other diseases of the circulatory system: Secondary | ICD-10-CM | POA: Diagnosis not present

## 2021-04-25 DIAGNOSIS — Z8042 Family history of malignant neoplasm of prostate: Secondary | ICD-10-CM | POA: Insufficient documentation

## 2021-04-25 DIAGNOSIS — Z79899 Other long term (current) drug therapy: Secondary | ICD-10-CM | POA: Diagnosis not present

## 2021-04-25 DIAGNOSIS — Z823 Family history of stroke: Secondary | ICD-10-CM | POA: Diagnosis not present

## 2021-04-25 DIAGNOSIS — K8010 Calculus of gallbladder with chronic cholecystitis without obstruction: Secondary | ICD-10-CM | POA: Diagnosis not present

## 2021-04-25 DIAGNOSIS — I10 Essential (primary) hypertension: Secondary | ICD-10-CM | POA: Diagnosis not present

## 2021-04-25 DIAGNOSIS — K8020 Calculus of gallbladder without cholecystitis without obstruction: Secondary | ICD-10-CM | POA: Diagnosis not present

## 2021-04-25 DIAGNOSIS — Z808 Family history of malignant neoplasm of other organs or systems: Secondary | ICD-10-CM | POA: Diagnosis not present

## 2021-04-25 DIAGNOSIS — Z880 Allergy status to penicillin: Secondary | ICD-10-CM | POA: Diagnosis not present

## 2021-04-25 DIAGNOSIS — K219 Gastro-esophageal reflux disease without esophagitis: Secondary | ICD-10-CM | POA: Diagnosis not present

## 2021-04-25 HISTORY — PX: CHOLECYSTECTOMY: SHX55

## 2021-04-25 SURGERY — LAPAROSCOPIC CHOLECYSTECTOMY WITH INTRAOPERATIVE CHOLANGIOGRAM
Anesthesia: General | Site: Abdomen

## 2021-04-25 MED ORDER — SCOPOLAMINE 1 MG/3DAYS TD PT72
1.0000 | MEDICATED_PATCH | TRANSDERMAL | Status: DC
  Administered 2021-04-25: 1.5 mg via TRANSDERMAL
  Filled 2021-04-25: qty 1

## 2021-04-25 MED ORDER — IOHEXOL 300 MG/ML  SOLN
INTRAMUSCULAR | Status: DC | PRN
  Administered 2021-04-25: 10 mL

## 2021-04-25 MED ORDER — FENTANYL CITRATE (PF) 100 MCG/2ML IJ SOLN
INTRAMUSCULAR | Status: AC
  Filled 2021-04-25: qty 2

## 2021-04-25 MED ORDER — CIPROFLOXACIN IN D5W 400 MG/200ML IV SOLN
INTRAVENOUS | Status: AC
  Filled 2021-04-25: qty 200

## 2021-04-25 MED ORDER — ONDANSETRON HCL 4 MG/2ML IJ SOLN
INTRAMUSCULAR | Status: DC | PRN
  Administered 2021-04-25: 4 mg via INTRAVENOUS

## 2021-04-25 MED ORDER — PROPOFOL 10 MG/ML IV BOLUS
INTRAVENOUS | Status: DC | PRN
  Administered 2021-04-25: 100 mg via INTRAVENOUS

## 2021-04-25 MED ORDER — TRAMADOL HCL 50 MG PO TABS
50.0000 mg | ORAL_TABLET | Freq: Four times a day (QID) | ORAL | Status: DC | PRN
  Filled 2021-04-25: qty 1

## 2021-04-25 MED ORDER — OXYCODONE HCL 5 MG PO TABS: 5.0000 mg | ORAL_TABLET | ORAL | Status: DC | PRN

## 2021-04-25 MED ORDER — ONDANSETRON 4 MG PO TBDP: 4.0000 mg | ORAL_TABLET | Freq: Four times a day (QID) | ORAL | Status: DC | PRN

## 2021-04-25 MED ORDER — HYDROCODONE-ACETAMINOPHEN 5-325 MG PO TABS: 1.0000 | ORAL_TABLET | Freq: Four times a day (QID) | ORAL | 0 refills | Status: DC | PRN

## 2021-04-25 MED ORDER — METOPROLOL TARTRATE 12.5 MG HALF TABLET: 12.5000 mg | ORAL_TABLET | Freq: Two times a day (BID) | ORAL | Status: DC | PRN

## 2021-04-25 MED ORDER — FENTANYL CITRATE (PF) 100 MCG/2ML IJ SOLN
INTRAMUSCULAR | Status: DC | PRN
  Administered 2021-04-25 (×4): 50 ug via INTRAVENOUS

## 2021-04-25 MED ORDER — LIDOCAINE 2% (20 MG/ML) 5 ML SYRINGE
INTRAMUSCULAR | Status: AC
  Filled 2021-04-25: qty 5

## 2021-04-25 MED ORDER — ONDANSETRON HCL 4 MG/2ML IJ SOLN: 4.0000 mg | Freq: Once | INTRAMUSCULAR | Status: DC | PRN

## 2021-04-25 MED ORDER — ACETAMINOPHEN 10 MG/ML IV SOLN
1000.0000 mg | Freq: Once | INTRAVENOUS | Status: DC | PRN
Start: 2021-04-25 — End: 2021-04-25

## 2021-04-25 MED ORDER — PROPOFOL 10 MG/ML IV BOLUS
INTRAVENOUS | Status: AC
  Filled 2021-04-25: qty 20

## 2021-04-25 MED ORDER — HYDROCHLOROTHIAZIDE 25 MG PO TABS
25.0000 mg | ORAL_TABLET | Freq: Every day | ORAL | Status: DC
  Administered 2021-04-25: 25 mg via ORAL
  Filled 2021-04-25: qty 1

## 2021-04-25 MED ORDER — DEXMEDETOMIDINE (PRECEDEX) IN NS 20 MCG/5ML (4 MCG/ML) IV SYRINGE
PREFILLED_SYRINGE | INTRAVENOUS | Status: AC
  Filled 2021-04-25: qty 5

## 2021-04-25 MED ORDER — LIDOCAINE 2% (20 MG/ML) 5 ML SYRINGE
INTRAMUSCULAR | Status: DC | PRN
  Administered 2021-04-25: 100 mg via INTRAVENOUS

## 2021-04-25 MED ORDER — METHIMAZOLE 5 MG PO TABS
5.0000 mg | ORAL_TABLET | Freq: Every day | ORAL | Status: DC
  Administered 2021-04-25: 5 mg via ORAL
  Filled 2021-04-25: qty 1

## 2021-04-25 MED ORDER — 0.9 % SODIUM CHLORIDE (POUR BTL) OPTIME
TOPICAL | Status: DC | PRN
  Administered 2021-04-25: 1000 mL

## 2021-04-25 MED ORDER — ROCURONIUM BROMIDE 10 MG/ML (PF) SYRINGE
PREFILLED_SYRINGE | INTRAVENOUS | Status: AC
  Filled 2021-04-25: qty 10

## 2021-04-25 MED ORDER — EPHEDRINE SULFATE-NACL 50-0.9 MG/10ML-% IV SOSY
PREFILLED_SYRINGE | INTRAVENOUS | Status: DC | PRN
  Administered 2021-04-25: 10 mg via INTRAVENOUS

## 2021-04-25 MED ORDER — SODIUM CHLORIDE 0.45 % IV SOLN: INTRAVENOUS | Status: DC

## 2021-04-25 MED ORDER — CHLORHEXIDINE GLUCONATE CLOTH 2 % EX PADS: 6.0000 | MEDICATED_PAD | Freq: Once | CUTANEOUS | Status: DC

## 2021-04-25 MED ORDER — BUDESONIDE 0.25 MG/2ML IN SUSP: 0.2500 mg | Freq: Two times a day (BID) | RESPIRATORY_TRACT | Status: DC

## 2021-04-25 MED ORDER — LACTATED RINGERS IV SOLN: INTRAVENOUS | Status: DC

## 2021-04-25 MED ORDER — DEXAMETHASONE SODIUM PHOSPHATE 10 MG/ML IJ SOLN
INTRAMUSCULAR | Status: DC | PRN
  Administered 2021-04-25: 7 mg via INTRAVENOUS

## 2021-04-25 MED ORDER — DEXMEDETOMIDINE (PRECEDEX) IN NS 20 MCG/5ML (4 MCG/ML) IV SYRINGE
PREFILLED_SYRINGE | INTRAVENOUS | Status: DC | PRN
  Administered 2021-04-25 (×3): 4 ug via INTRAVENOUS

## 2021-04-25 MED ORDER — DEXAMETHASONE SODIUM PHOSPHATE 10 MG/ML IJ SOLN
INTRAMUSCULAR | Status: AC
  Filled 2021-04-25: qty 1

## 2021-04-25 MED ORDER — ROCURONIUM BROMIDE 10 MG/ML (PF) SYRINGE
PREFILLED_SYRINGE | INTRAVENOUS | Status: DC | PRN
  Administered 2021-04-25: 50 mg via INTRAVENOUS

## 2021-04-25 MED ORDER — ONDANSETRON HCL 4 MG/2ML IJ SOLN
INTRAMUSCULAR | Status: AC
  Filled 2021-04-25: qty 2

## 2021-04-25 MED ORDER — FENTANYL CITRATE (PF) 100 MCG/2ML IJ SOLN: 25.0000 ug | INTRAMUSCULAR | Status: DC | PRN

## 2021-04-25 MED ORDER — SUGAMMADEX SODIUM 200 MG/2ML IV SOLN
INTRAVENOUS | Status: DC | PRN
Start: 2021-04-25 — End: 2021-04-25
  Administered 2021-04-25: 150 mg via INTRAVENOUS

## 2021-04-25 MED ORDER — ACETAMINOPHEN 325 MG PO TABS
650.0000 mg | ORAL_TABLET | Freq: Four times a day (QID) | ORAL | Status: DC | PRN
  Administered 2021-04-25: 650 mg via ORAL
  Filled 2021-04-25: qty 2

## 2021-04-25 MED ORDER — ORAL CARE MOUTH RINSE
15.0000 mL | Freq: Once | OROMUCOSAL | Status: AC
  Administered 2021-04-25: 15 mL via OROMUCOSAL

## 2021-04-25 MED ORDER — CIPROFLOXACIN IN D5W 400 MG/200ML IV SOLN
400.0000 mg | INTRAVENOUS | Status: AC
  Administered 2021-04-25: 400 mg via INTRAVENOUS

## 2021-04-25 MED ORDER — BUPIVACAINE-EPINEPHRINE 0.5% -1:200000 IJ SOLN
INTRAMUSCULAR | Status: DC | PRN
  Administered 2021-04-25: 30 mL

## 2021-04-25 MED ORDER — CHLORHEXIDINE GLUCONATE 0.12 % MT SOLN: 15.0000 mL | Freq: Once | OROMUCOSAL | Status: AC

## 2021-04-25 MED ORDER — ONDANSETRON HCL 4 MG/2ML IJ SOLN: 4.0000 mg | Freq: Four times a day (QID) | INTRAMUSCULAR | Status: DC | PRN

## 2021-04-25 MED ORDER — HYDROMORPHONE HCL 1 MG/ML IJ SOLN
1.0000 mg | INTRAMUSCULAR | Status: DC | PRN
Start: 2021-04-25 — End: 2021-04-25

## 2021-04-25 MED ORDER — LACTATED RINGERS IR SOLN
Status: DC | PRN
  Administered 2021-04-25: 1000 mL

## 2021-04-25 MED ORDER — ACETAMINOPHEN 650 MG RE SUPP: 650.0000 mg | Freq: Four times a day (QID) | RECTAL | Status: DC | PRN

## 2021-04-25 MED ORDER — EPHEDRINE 5 MG/ML INJ
INTRAVENOUS | Status: AC
  Filled 2021-04-25: qty 5

## 2021-04-25 SURGICAL SUPPLY — 41 items
ADH SKN CLS APL DERMABOND .7 (GAUZE/BANDAGES/DRESSINGS) ×1
APL PRP STRL LF DISP 70% ISPRP (MISCELLANEOUS) ×2
APPLIER CLIP ROT 10 11.4 M/L (STAPLE) ×2
APR CLP MED LRG 11.4X10 (STAPLE) ×1
BAG COUNTER SPONGE SURGICOUNT (BAG) IMPLANT
BAG SPEC RTRVL LRG 6X4 10 (ENDOMECHANICALS) ×1
BAG SPNG CNTER NS LX DISP (BAG)
CABLE HIGH FREQUENCY MONO STRZ (ELECTRODE) ×2 IMPLANT
CHLORAPREP W/TINT 26 (MISCELLANEOUS) ×4 IMPLANT
CLIP APPLIE ROT 10 11.4 M/L (STAPLE) ×1 IMPLANT
COVER MAYO STAND STRL (DRAPES) ×2 IMPLANT
COVER SURGICAL LIGHT HANDLE (MISCELLANEOUS) ×2 IMPLANT
DECANTER SPIKE VIAL GLASS SM (MISCELLANEOUS) ×2 IMPLANT
DERMABOND ADVANCED (GAUZE/BANDAGES/DRESSINGS) ×1
DERMABOND ADVANCED .7 DNX12 (GAUZE/BANDAGES/DRESSINGS) IMPLANT
DRAPE C-ARM 42X120 X-RAY (DRAPES) ×2 IMPLANT
ELECT REM PT RETURN 15FT ADLT (MISCELLANEOUS) ×2 IMPLANT
GAUZE SPONGE 2X2 8PLY STRL LF (GAUZE/BANDAGES/DRESSINGS) ×1 IMPLANT
GLOVE SURG SYN 7.5  E (GLOVE) ×4
GLOVE SURG SYN 7.5 E (GLOVE) ×2 IMPLANT
GLOVE SURG SYN 7.5 PF PI (GLOVE) ×2 IMPLANT
GOWN STRL REUS W/TWL XL LVL3 (GOWN DISPOSABLE) ×4 IMPLANT
HEMOSTAT SURGICEL 4X8 (HEMOSTASIS) IMPLANT
KIT BASIN OR (CUSTOM PROCEDURE TRAY) ×2 IMPLANT
KIT TURNOVER KIT A (KITS) ×2 IMPLANT
PENCIL SMOKE EVACUATOR (MISCELLANEOUS) IMPLANT
POUCH SPECIMEN RETRIEVAL 10MM (ENDOMECHANICALS) ×2 IMPLANT
SCISSORS LAP 5X35 DISP (ENDOMECHANICALS) ×2 IMPLANT
SET CHOLANGIOGRAPH MIX (MISCELLANEOUS) ×2 IMPLANT
SET IRRIG TUBING LAPAROSCOPIC (IRRIGATION / IRRIGATOR) ×2 IMPLANT
SET TUBE SMOKE EVAC HIGH FLOW (TUBING) IMPLANT
SLEEVE XCEL OPT CAN 5 100 (ENDOMECHANICALS) ×2 IMPLANT
SPONGE GAUZE 2X2 STER 10/PKG (GAUZE/BANDAGES/DRESSINGS) ×1
STRIP CLOSURE SKIN 1/2X4 (GAUZE/BANDAGES/DRESSINGS) IMPLANT
SUT MNCRL AB 4-0 PS2 18 (SUTURE) ×2 IMPLANT
TOWEL OR 17X26 10 PK STRL BLUE (TOWEL DISPOSABLE) ×2 IMPLANT
TOWEL OR NON WOVEN STRL DISP B (DISPOSABLE) ×2 IMPLANT
TRAY LAPAROSCOPIC (CUSTOM PROCEDURE TRAY) ×2 IMPLANT
TROCAR BLADELESS OPT 5 100 (ENDOMECHANICALS) ×2 IMPLANT
TROCAR XCEL BLUNT TIP 100MML (ENDOMECHANICALS) ×2 IMPLANT
TROCAR XCEL NON-BLD 11X100MML (ENDOMECHANICALS) ×2 IMPLANT

## 2021-04-25 NOTE — Discharge Summary (Signed)
Physician Discharge Summary   Patient ID: Megan Huang MRN: [id number] DOB/AGE: [birth year] 75 y.o.  Admit date: 04/25/2021  Discharge date: 04/25/2021  Discharge Diagnoses:  Principal Problem:   Cholelithiasis with chronic cholecystitis Active Problems:   Cholecystitis with cholelithiasis   Discharged Condition: good  Hospital Course: Patient was admitted for observation following gallbladder surgery.  Post op course was uncomplicated.  Pain was well controlled.  Tolerated diet.  Patient was prepared for discharge home on the afternoon of surgery.   Consults: None  Treatments: surgery: laparoscopic cholecystectomy with cholangiography  Discharge Exam: Blood pressure 122/62, pulse 67, temperature 98.2 F (36.8 C), temperature source Oral, resp. rate 14, height '5\' 3"'$  (1.6 m), weight 74.4 kg, last menstrual period 09/04/1996, SpO2 100 %.  See nurse's notes.   Disposition: Home  Discharge Instructions     Diet - low sodium heart healthy   Complete by: As directed    Increase activity slowly   Complete by: As directed    No dressing needed   Complete by: As directed       Allergies as of 04/25/2021       Reactions   Black Pepper [piper] Anaphylaxis   "pepper corn"   Fish Allergy Anaphylaxis   Shellfish Allergy Anaphylaxis   Wasp Venom Anaphylaxis   Aspirin    Bleeding, blood vessels to rupture, per pt   Ibuprofen    Bleeding, blood vessels to rupture, per pt   Moxifloxacin Other (See Comments)   Pt received with recent cataract surgery in 2022 and caused redness and irritation to eye    Penicillins Hives, Diarrhea   Wool Alcohol [lanolin] Hives        Medication List     TAKE these medications    Arnuity Ellipta 100 MCG/ACT Aepb Generic drug: Fluticasone Furoate Inhale 1 puff into the lungs daily.   atorvastatin 10 MG tablet Commonly known as: LIPITOR Take 5 mg by mouth daily.   CALCIUM + D PO Take 1 tablet by mouth in the morning and at  bedtime.   cetirizine 10 MG tablet Commonly known as: ZYRTEC Take 10 mg by mouth daily.   famotidine 20 MG tablet Commonly known as: PEPCID Take 20 mg by mouth daily.   fluticasone 50 MCG/ACT nasal spray Commonly known as: FLONASE Place 1 spray into both nostrils daily as needed for allergies.   hydrochlorothiazide 25 MG tablet Commonly known as: HYDRODIURIL Take 25 mg by mouth daily.   HYDROcodone-acetaminophen 5-325 MG tablet Commonly known as: NORCO/VICODIN Take 1-2 tablets by mouth every 6 (six) hours as needed for moderate pain.   methimazole 10 MG tablet Commonly known as: TAPAZOLE Take 5 mg by mouth daily.   metoprolol tartrate 25 MG tablet Commonly known as: LOPRESSOR Take 12.5 mg by mouth 2 (two) times daily as needed (Heart rhythem).   multivitamin tablet Take 1 tablet by mouth daily.   nitroGLYCERIN 0.4 MG SL tablet Commonly known as: NITROSTAT Place 0.4 mg under the tongue every 5 (five) minutes as needed for chest pain.   ProAir HFA 108 (90 Base) MCG/ACT inhaler Generic drug: albuterol Inhale 2 puffs into the lungs every 6 (six) hours as needed for wheezing or shortness of breath.   SYSTANE OP Place 1 drop into both eyes in the morning and at bedtime.               Discharge Care Instructions  (From admission, onward)  Start     Ordered   04/25/21 0000  No dressing needed        04/25/21 1640            Follow-up Information     Armandina Gemma, MD. Schedule an appointment as soon as possible for a visit in 3 week(s).   Specialty: General Surgery Why: For wound re-check Contact information: [place] [address] [phone]                 Chelsie Burel, [facility] Office: [phone]   Signed: Armandina Gemma 04/25/2021, 4:41 PM

## 2021-04-25 NOTE — Transfer of Care (Signed)
Immediate Anesthesia Transfer of Care Note  Patient: Megan Huang  Procedure(s) Performed: LAPAROSCOPIC CHOLECYSTECTOMY WITH INTRAOPERATIVE CHOLANGIOGRAM (Abdomen)  Patient Location: PACU  Anesthesia Type:General  Level of Consciousness: awake, alert  and oriented  Airway & Oxygen Therapy: Patient Spontanous Breathing and Patient connected to face mask oxygen  Post-op Assessment: Report given to RN and Post -op Vital signs reviewed and stable  Post vital signs: Reviewed and stable  Last Vitals:  Vitals Value Taken Time  BP    Temp    Pulse 79 04/25/21 1029  Resp 12 04/25/21 1029  SpO2 100 % 04/25/21 1029  Vitals shown include unvalidated device data.  Last Pain:  Vitals:   04/25/21 0807  TempSrc:   PainSc: 0-No pain         Complications: No notable events documented.

## 2021-04-25 NOTE — Anesthesia Procedure Notes (Signed)
Procedure Name: Intubation Date/Time: 04/25/2021 9:20 AM Performed by: Sharlette Dense, CRNA Pre-anesthesia Checklist: Patient identified, Emergency Drugs available, Suction available and Patient being monitored Patient Re-evaluated:Patient Re-evaluated prior to induction Oxygen Delivery Method: Circle system utilized Preoxygenation: Pre-oxygenation with 100% oxygen Induction Type: IV induction Ventilation: Mask ventilation without difficulty Laryngoscope Size: Miller and 2 Grade View: Grade I Tube type: Oral Tube size: 7.5 mm Number of attempts: 1 Airway Equipment and Method: Stylet and Oral airway Placement Confirmation: ETT inserted through vocal cords under direct vision, positive ETCO2 and breath sounds checked- equal and bilateral Secured at: 21 cm Tube secured with: Tape Dental Injury: Teeth and Oropharynx as per pre-operative assessment

## 2021-04-25 NOTE — Op Note (Signed)
Procedure Note  Pre-operative Diagnosis:  chronic cholecystitis, cholelithiasis  Post-operative Diagnosis:  same  Surgeon:  Armandina Gemma, MD  Assistant:  none   Procedure:  Laparoscopic cholecystectomy with intra-operative cholangiography  Anesthesia:  General  Estimated Blood Loss:  minimal  Drains: none         Specimen: gallbladder to pathology  Indications:  Patient is referred by Dr. Lavone Orn for surgical evaluation and management of chronic cholecystitis and cholelithiasis.  Patient has about a one-year history of intermittent abdominal pain associated with nausea and emesis.  This frequently occurs after meals.  Patient denies any history of jaundice or acholic stools.  She denies fevers or chills.  Episodes frequently occur after meals and lasts for a few hours.  Patient has had a prior bilateral tubal ligation and abdominal hysterectomy.  Otherwise she has had no additional abdominal surgery.  Patient underwent an abdominal ultrasound on November 02, 2020.  This demonstrated mobile gallstones.  There is no biliary dilatation.  There was diffuse hepatic steatosis.  Patient now comes to surgery for cholecystectomy.  Procedure Details:  The patient was seen in the pre-op holding area. The risks, benefits, complications, treatment options, and expected outcomes were previously discussed with the patient. The patient agreed with the proposed plan and has signed the informed consent form.  The patient was transported to operating room #1 at the [facility]. The patient was placed in the supine position on the operating room table. Following induction of general anesthesia, the abdomen was prepped and draped in the usual aseptic fashion.  An incision was made in the skin near the umbilicus. The midline fascia was incised and the peritoneal cavity was entered and a Hasson cannula was introduced under direct vision. The cannula was secured with a 0-Vicryl pursestring suture.  Pneumoperitoneum was established with carbon dioxide. Additional cannulae were introduced under direct vision along the right costal margin in the midline, mid-clavicular line, and anterior axillary line.   The gallbladder was identified and the fundus grasped and retracted cephalad. Adhesions were taken down bluntly and the electrocautery was utilized as needed, taking care not to involve any adjacent structures. The infundibulum was grasped and retracted laterally, exposing the peritoneum overlying the triangle of Calot. The peritoneum was incised and structures exposed with blunt dissection. The cystic duct was clearly identified, bluntly dissected circumferentially, and clipped at the neck of the gallbladder.  An incision was made in the cystic duct and the cholangiogram catheter introduced. The catheter was secured using an ligaclip.  Real-time cholangiography was performed using C-arm fluoroscopy.  There was rapid filling of a normal caliber common bile duct.  There was reflux of contrast into the left and right hepatic ductal systems.  There was free flow distally into the duodenum without filling defect or obstruction.  The catheter was removed from the peritoneal cavity.  The cystic duct was then ligated with ligaclips and divided. The cystic artery was identified, dissected circumferentially, ligated with ligaclips, and divided.  The gallbladder was dissected away from the gallbladder bed using the electrocautery for hemostasis. The gallbladder was completely removed from the liver and placed into an endocatch bag. The gallbladder was removed in the endocatch bag through the umbilical port site and submitted to pathology for review.  The right upper quadrant was irrigated and the gallbladder bed was inspected. Hemostasis was achieved with the electrocautery.  Cannulae were removed under direct vision and good hemostasis was noted. Pneumoperitoneum was released and the majority of the  carbon  dioxide evacuated. The umbilical wound was irrigated and the fascia was then closed with the pursestring suture.  Local anesthetic was infiltrated at all port sites. Skin incisions were closed with 4-0 Monocril subcuticular sutures and Dermabond was applied.  Instrument, sponge, and needle counts were correct at the conclusion of the case.  The patient was awakened from anesthesia and brought to the recovery room in stable condition.  The patient tolerated the procedure well.   Armandina Gemma, MD [facility] Office: [phone]

## 2021-04-25 NOTE — Discharge Instructions (Signed)
[facility]  LAPAROSCOPIC SURGERY:  POST-OP INSTRUCTIONS  Always review your discharge instruction sheet given to you by the facility where your surgery was performed.  A prescription for pain medication may be given to you upon discharge.  Take your pain medication as prescribed.  If narcotic pain medicine is not needed, then you may take acetaminophen (Tylenol) or ibuprofen (Advil) as needed.  Take your usually prescribed medications unless otherwise directed.  If you need a refill on your pain medication, please contact your pharmacy.  They will contact our office to request authorization. Prescriptions will not be filled after 5 P.M. or on weekends.  You should follow a light diet the first few days after arrival home, such as soup and crackers or toast.  Be sure to include plenty of fluids daily.  Most patients will experience some swelling and bruising in the area of the incisions.  Ice packs will help.  Swelling and bruising can take several days to resolve.   It is common to experience some constipation after surgery.  Increasing fluid intake and taking a stool softener (such as Colace) will usually help or prevent this problem from occurring.  A mild laxative (Milk of Magnesia or Miralax) should be taken according to package instructions if there has been no bowel movement after 48 hours.  You will likely have Dermabond (topical glue) over your incisions.  This seals the incisions and allows you to bathe and shower at any time after your surgery.  Glue should remain in place for up to 10 days.  It may be removed after 10 days by pealing off the Dermabond material or using Vaseline or naval jelly to remove.  If you have steri-strips over your incisions, you may remove the gauze bandage on the second day after surgery, and you may shower at that time.  Leave your steri-strips (small skin tapes) in place directly over the incision.  These strips should remain on the  skin for 5-7 days and then be removed.  You may get them wet in the shower and pat them dry.  Any sutures or staples will be removed at the office during your follow-up visit.  ACTIVITIES:  You may resume regular (light) daily activities beginning the next day - such as daily self-care, walking, climbing stairs - gradually increasing activities as tolerated.  You may have sexual intercourse when it is comfortable.  Refrain from any heavy lifting or straining until approved by your doctor.  You may drive when you are no longer taking prescription pain medication, when you can comfortably wear a seatbelt, and when you can safely maneuver your car and apply brakes.  You should see your doctor in the office for a follow-up appointment approximately 2-3 weeks after your surgery.  Make sure that you call for this appointment within a day or two after you arrive home to insure a convenient appointment time.  WHEN TO CALL YOUR DOCTOR: Fever over 101.0 Inability to urinate Continued bleeding from incision Increased pain, redness, or drainage from the incision Increasing abdominal pain  The clinic staff is available to answer your questions during regular business hours.  Please don't hesitate to call and ask to speak to one of the nurses for clinical concerns.  If you have a medical emergency, go to the nearest emergency room or call 911.  A surgeon from [facility] is always on call for the hospital.  Megan Eager, MD [facility] Office: [phone] Toll Free:    [phone] FAX [phone]  Website: [email or web address] 

## 2021-04-25 NOTE — Interval H&P Note (Signed)
History and Physical Interval Note:  04/25/2021 8:56 AM  Megan Huang  has presented today for surgery, with the diagnosis of CHRONIC CHOLECYSTITIS, CHOLELITHIASIS.  The various methods of treatment have been discussed with the patient and family. After consideration of risks, benefits and other options for treatment, the patient has consented to    Procedure(s): LAPAROSCOPIC CHOLECYSTECTOMY WITH INTRAOPERATIVE CHOLANGIOGRAM (N/A) as a surgical intervention.    The patient's history has been reviewed, patient examined, no change in status, stable for surgery.  I have reviewed the patient's chart and labs.  Questions were answered to the patient's satisfaction.    Armandina Gemma, [facility] [place] practice Office: [place]

## 2021-04-25 NOTE — Anesthesia Postprocedure Evaluation (Signed)
Anesthesia Post Note  Patient: Megan Huang  Procedure(s) Performed: LAPAROSCOPIC CHOLECYSTECTOMY WITH INTRAOPERATIVE CHOLANGIOGRAM (Abdomen)     Patient location during evaluation: PACU Anesthesia Type: General Level of consciousness: awake and alert Pain management: pain level controlled Vital Signs Assessment: post-procedure vital signs reviewed and stable Respiratory status: spontaneous breathing, nonlabored ventilation, respiratory function stable and patient connected to nasal cannula oxygen Cardiovascular status: blood pressure returned to baseline and stable Postop Assessment: no apparent nausea or vomiting Anesthetic complications: no   No notable events documented.  Last Vitals:  Vitals:   04/25/21 1227 04/25/21 1321  BP: (!) 130/55 119/66  Pulse: 64 67  Resp: 14 14  Temp: 37.5 C 36.7 C  SpO2: 99% 99%    Last Pain:  Vitals:   04/25/21 1321  TempSrc: Oral  PainSc:                  Barnet [place]

## 2021-04-26 ENCOUNTER — Encounter (HOSPITAL_COMMUNITY): Payer: Self-pay | Admitting: Surgery

## 2021-04-26 LAB — SURGICAL PATHOLOGY

## 2021-04-26 NOTE — Progress Notes (Signed)
Pathology is benign as expected.  [place], MD [facility] [place] [facility] Office: [phone]

## 2021-04-27 LAB — CA 125: CA 125: 8 U/mL (ref <35)

## 2021-04-27 LAB — TESTOSTERONE, TOTAL, LC/MS/MS: Testosterone, Total, LC-MS-MS: 24 ng/dL (ref 2–45)

## 2021-04-27 LAB — 17-HYDROXYPROGESTERONE: 17-OH-Progesterone, LC/MS/MS: 22 ng/dL

## 2021-04-28 DIAGNOSIS — K8010 Calculus of gallbladder with chronic cholecystitis without obstruction: Secondary | ICD-10-CM

## 2021-05-03 DIAGNOSIS — K219 Gastro-esophageal reflux disease without esophagitis: Secondary | ICD-10-CM | POA: Diagnosis not present

## 2021-05-03 DIAGNOSIS — H259 Unspecified age-related cataract: Secondary | ICD-10-CM | POA: Diagnosis not present

## 2021-05-03 DIAGNOSIS — I10 Essential (primary) hypertension: Secondary | ICD-10-CM | POA: Diagnosis not present

## 2021-05-03 DIAGNOSIS — J4520 Mild intermittent asthma, uncomplicated: Secondary | ICD-10-CM | POA: Diagnosis not present

## 2021-10-20 DIAGNOSIS — H26491 Other secondary cataract, right eye: Secondary | ICD-10-CM | POA: Diagnosis not present

## 2021-10-20 DIAGNOSIS — H2512 Age-related nuclear cataract, left eye: Secondary | ICD-10-CM | POA: Diagnosis not present

## 2021-10-20 DIAGNOSIS — H43813 Vitreous degeneration, bilateral: Secondary | ICD-10-CM | POA: Diagnosis not present

## 2021-10-20 DIAGNOSIS — D3132 Benign neoplasm of left choroid: Secondary | ICD-10-CM | POA: Diagnosis not present

## 2021-10-20 IMAGING — US US ABDOMEN LIMITED RUQ/ASCITES
1 series · 14 of 25 positions shown · non-contrast
Comparison: None.

CLINICAL DATA: Right upper quadrant pain for several months. Nausea
and vomiting. Diarrhea.

EXAM:
ULTRASOUND ABDOMEN LIMITED RIGHT UPPER QUADRANT

[Series 1: us abdomen limited ruq/ascites · 0.15mm/px · 14 of 44 slices shown]
[im 1/44]
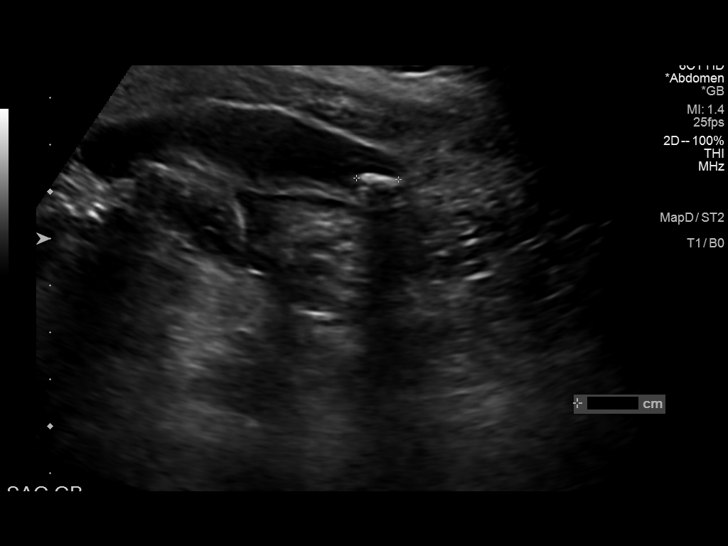
[im 4/44]
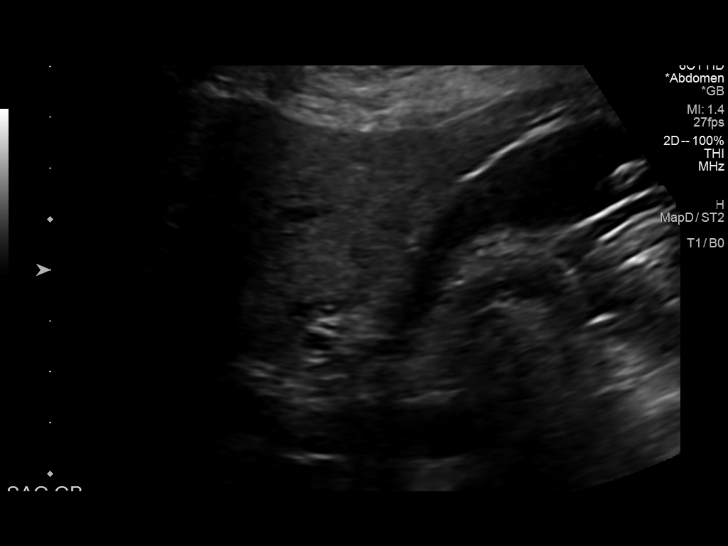
[im 8/44]
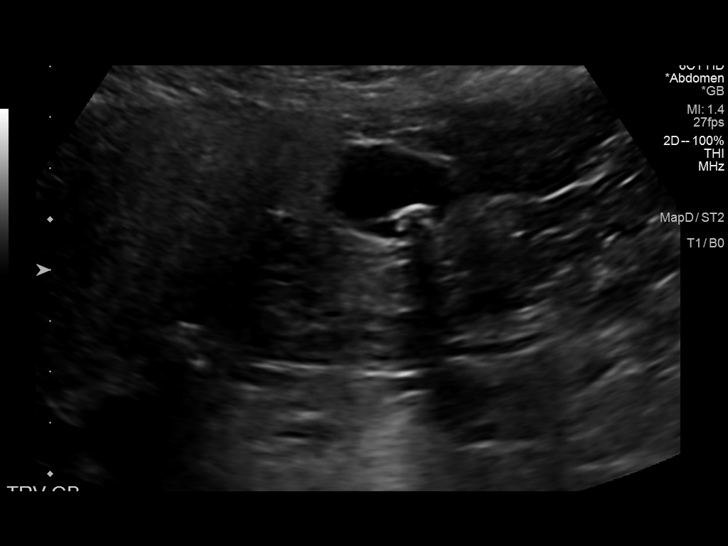
[im 11/44]
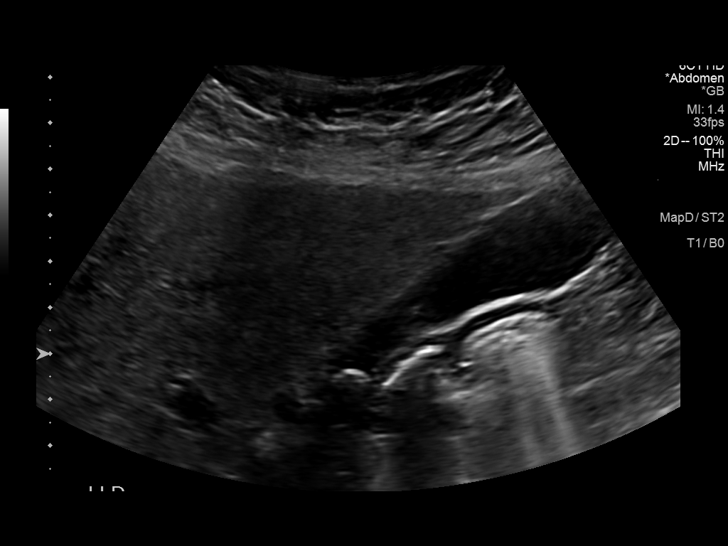
[im 15/44]
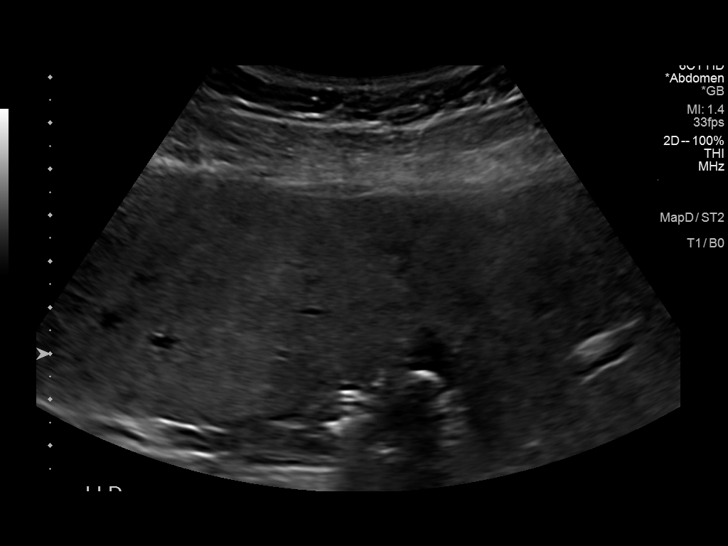
[im 17/44]
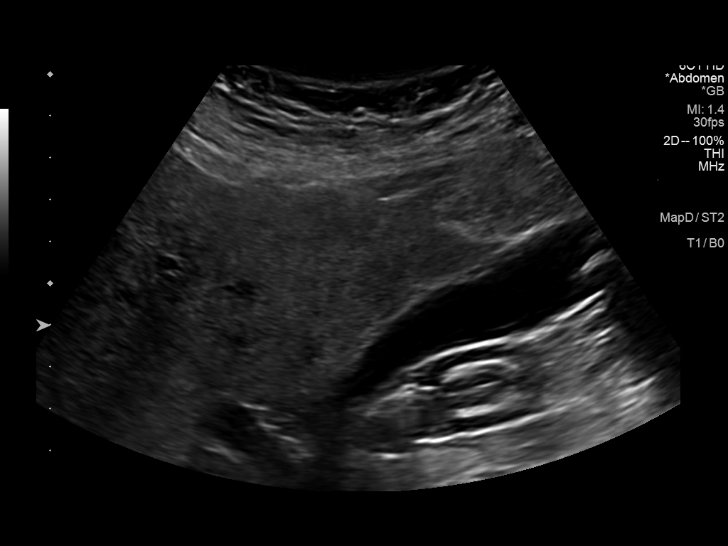
[im 20/44]
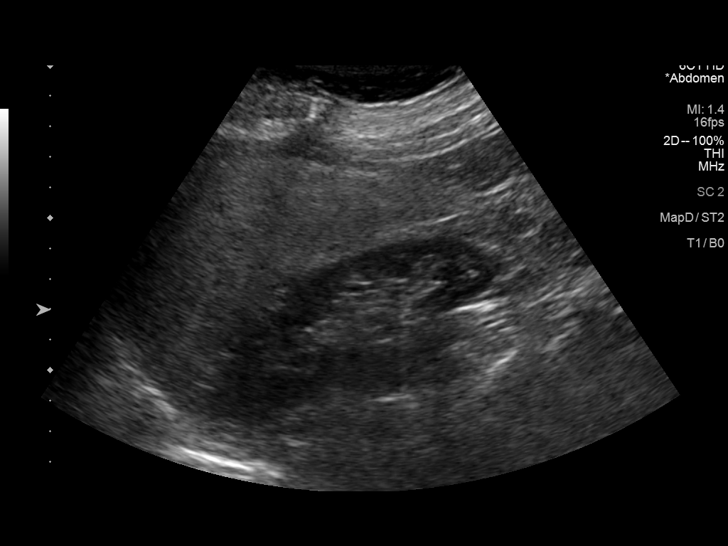
[im 24/44]
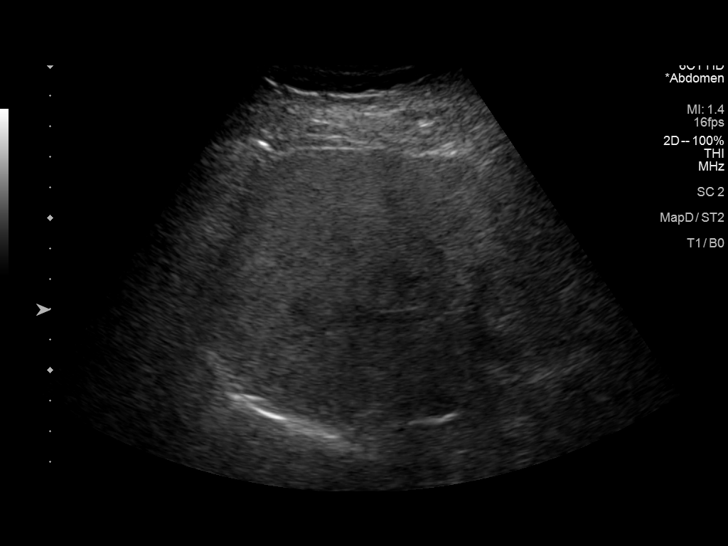
[im 27/44]
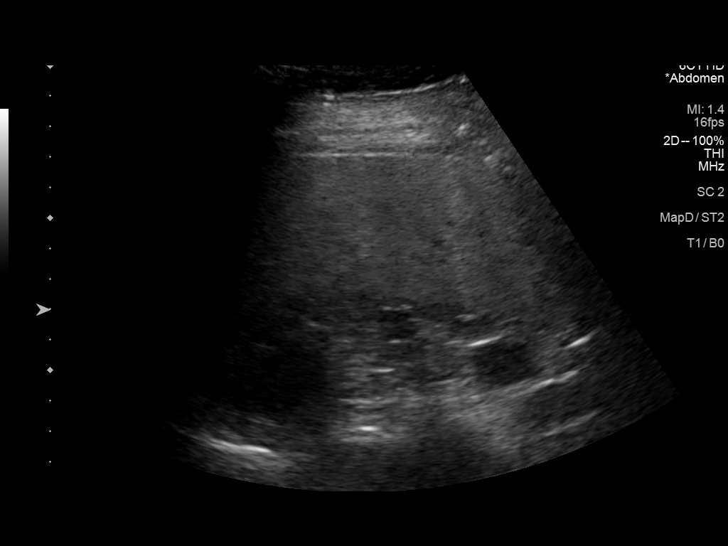
[im 29/44]
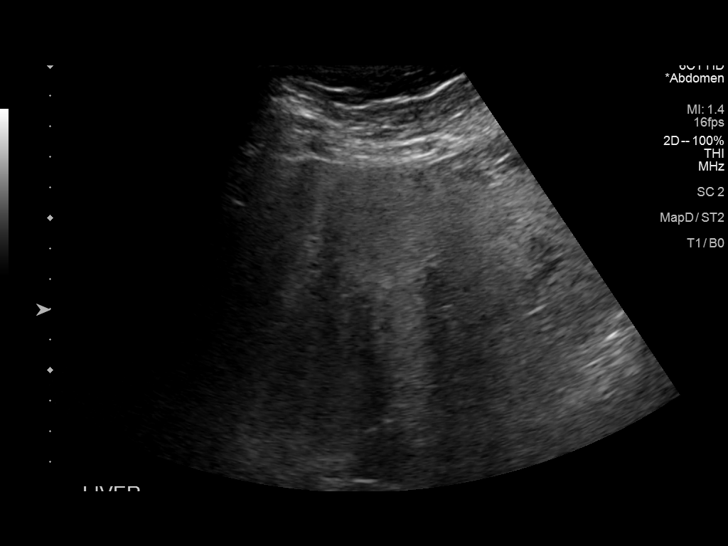
[im 33/44]
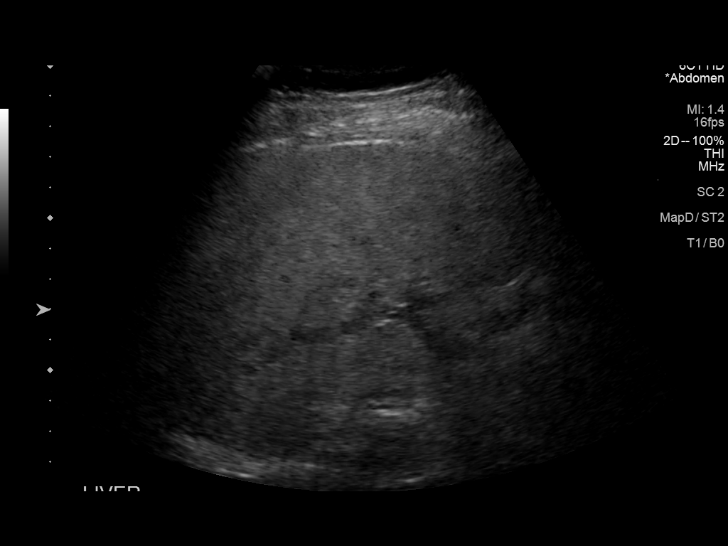
[im 36/44]
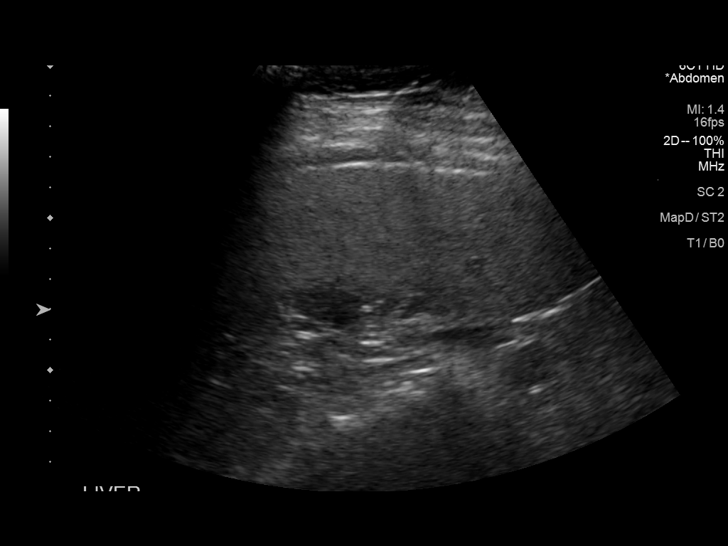
[im 40/44]
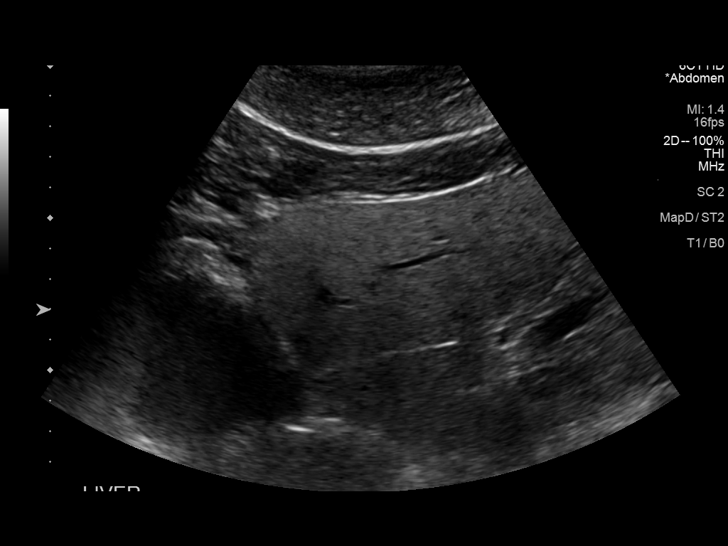
[im 44/44]
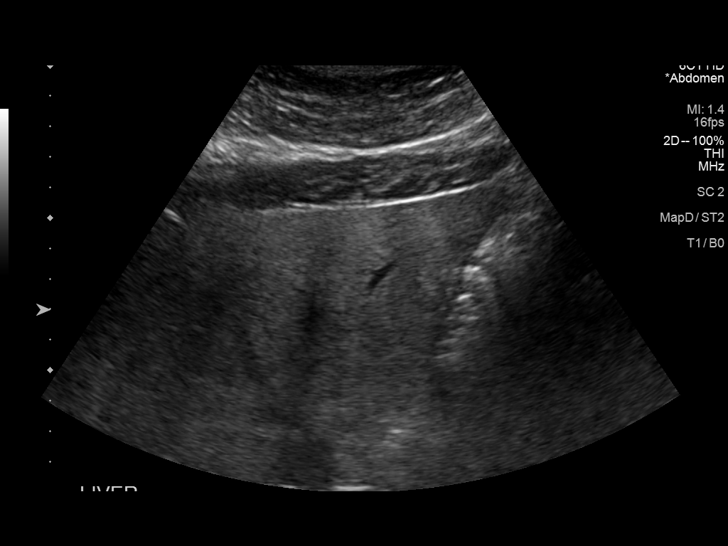

[14 of 25 positions shown; findings below may reference images not displayed]

FINDINGS: Gallbladder:

A mobile gallstone is seen which measures 9 mm. No evidence of
gallbladder wall thickening or pericholecystic fluid. No sonographic
Murphy sign noted by sonographer.

Common bile duct:

Diameter: 2 mm, within normal limits.

Liver:

Diffusely increased echogenicity of the hepatic parenchyma,
consistent with hepatic steatosis. No hepatic mass identified.
Portal vein is patent on color Doppler imaging with normal direction
of blood flow towards the liver.

Other: None.
IMPRESSION: Cholelithiasis. No sonographic signs of cholecystitis or biliary
ductal dilatation.

Diffuse hepatic steatosis.

## 2021-11-03 DIAGNOSIS — E7800 Pure hypercholesterolemia, unspecified: Secondary | ICD-10-CM | POA: Diagnosis not present

## 2021-11-03 DIAGNOSIS — K219 Gastro-esophageal reflux disease without esophagitis: Secondary | ICD-10-CM | POA: Diagnosis not present

## 2021-11-03 DIAGNOSIS — M8580 Other specified disorders of bone density and structure, unspecified site: Secondary | ICD-10-CM | POA: Diagnosis not present

## 2021-11-03 DIAGNOSIS — E0520 Thyrotoxicosis with toxic multinodular goiter without thyrotoxic crisis or storm: Secondary | ICD-10-CM | POA: Diagnosis not present

## 2021-11-03 DIAGNOSIS — Z0000 Encounter for general adult medical examination without abnormal findings: Secondary | ICD-10-CM | POA: Diagnosis not present

## 2021-11-03 DIAGNOSIS — E0590 Thyrotoxicosis, unspecified without thyrotoxic crisis or storm: Secondary | ICD-10-CM | POA: Diagnosis not present

## 2021-11-03 DIAGNOSIS — J4520 Mild intermittent asthma, uncomplicated: Secondary | ICD-10-CM | POA: Diagnosis not present

## 2021-11-03 DIAGNOSIS — I10 Essential (primary) hypertension: Secondary | ICD-10-CM | POA: Diagnosis not present

## 2021-11-14 ENCOUNTER — Other Ambulatory Visit: Payer: Self-pay | Admitting: Internal Medicine

## 2021-11-14 DIAGNOSIS — E7800 Pure hypercholesterolemia, unspecified: Secondary | ICD-10-CM

## 2021-11-22 DIAGNOSIS — M8589 Other specified disorders of bone density and structure, multiple sites: Secondary | ICD-10-CM | POA: Diagnosis not present

## 2021-11-22 DIAGNOSIS — Z780 Asymptomatic menopausal state: Secondary | ICD-10-CM | POA: Diagnosis not present

## 2021-11-23 DIAGNOSIS — R030 Elevated blood-pressure reading, without diagnosis of hypertension: Secondary | ICD-10-CM | POA: Diagnosis not present

## 2021-11-23 DIAGNOSIS — H6121 Impacted cerumen, right ear: Secondary | ICD-10-CM | POA: Diagnosis not present

## 2021-11-24 ENCOUNTER — Encounter: Payer: Self-pay | Admitting: Obstetrics and Gynecology

## 2021-11-30 DIAGNOSIS — H25042 Posterior subcapsular polar age-related cataract, left eye: Secondary | ICD-10-CM | POA: Diagnosis not present

## 2021-11-30 DIAGNOSIS — H2512 Age-related nuclear cataract, left eye: Secondary | ICD-10-CM | POA: Diagnosis not present

## 2021-11-30 DIAGNOSIS — H26491 Other secondary cataract, right eye: Secondary | ICD-10-CM | POA: Diagnosis not present

## 2021-11-30 DIAGNOSIS — H02831 Dermatochalasis of right upper eyelid: Secondary | ICD-10-CM | POA: Diagnosis not present

## 2021-12-06 DIAGNOSIS — I10 Essential (primary) hypertension: Secondary | ICD-10-CM | POA: Diagnosis not present

## 2021-12-15 ENCOUNTER — Ambulatory Visit
Admission: RE | Admit: 2021-12-15 | Discharge: 2021-12-15 | Disposition: A | Discharge status: Home / Self Care | Payer: No Typology Code available for payment source | Source: Ambulatory Visit | Attending: Internal Medicine | Admitting: Internal Medicine

## 2021-12-15 DIAGNOSIS — E7800 Pure hypercholesterolemia, unspecified: Secondary | ICD-10-CM

## 2021-12-21 ENCOUNTER — Ambulatory Visit (INDEPENDENT_AMBULATORY_CARE_PROVIDER_SITE_OTHER): Payer: BC Managed Care – PPO | Admitting: Obstetrics and Gynecology

## 2021-12-21 ENCOUNTER — Encounter: Payer: Self-pay | Admitting: Obstetrics and Gynecology

## 2021-12-21 VITALS — BP 122/78 | HR 80 | Ht 63.0 in | Wt 164.0 lb

## 2021-12-21 DIAGNOSIS — M810 Age-related osteoporosis without current pathological fracture: Secondary | ICD-10-CM

## 2021-12-21 NOTE — Progress Notes (Signed)
GYNECOLOGY  VISIT ?  ?HPI: ?76 y.o.   Married White or Caucasian Not Hispanic or Latino  female   ?[id number] with Patient's last menstrual period was 09/04/1996.   ?here to discuss dexa. DEXA from last month returned with a T score of -1.8, FRAX 18/4%. ? ?She has a h/o GERD on Pepcid. Small hiatal hernia diagnosed on recent 12/15/21.  ? ?She is on calcium and vit D. ?She walks daily. ? ?GYNECOLOGIC HISTORY: ?Patient's last menstrual period was 09/04/1996. ?Contraception:post menopausal ?Menopausal hormone therapy: n/a ?       ?OB History   ? ? Gravida  ?6  ? Para  ?2  ? Term  ?2  ? Preterm  ?   ? AB  ?4  ? Living  ?2  ?  ? ? SAB  ?   ? IAB  ?   ? Ectopic  ?   ? Multiple  ?   ? Live Births  ?   ?   ?  ?  ?    ? ?Patient Active Problem List  ? Diagnosis Date Noted  ? Cholecystitis with cholelithiasis 04/25/2021  ? 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 VWD (von Willebrand's disease) ([place]) 09/23/2015  ? Bleeding diathesis ([place]) 09/23/2015  ? Family history of cancer   ? Elevated cholesterol   ? Fibroid   ? Osteopenia   ? DUB (dysfunctional uterine bleeding)   ? Thyroid disease   ? GERD (gastroesophageal reflux disease)   ? Hypertension   ? ? ?Past Medical History:  ?Diagnosis Date  ? Arthritis   ? Asthma   ? DUB (dysfunctional uterine bleeding)   ? Elevated cholesterol   ? Family history of adverse reaction to anesthesia   ? mother woke up during anesthesia and hallucinations afterwards  ? Family history of cancer   ? Fibroid   ? GERD (gastroesophageal reflux disease)   ? Headache   ? Hypertension   ? Osteopenia   ? Osteopenia   ? PONV (postoperative nausea and vomiting)   ? Thyroid disease   ? Hyperthyroid  ? Von Willebrand disease ([employer])   ? hx of per pt  ? ? ?Past Surgical History:  ?Procedure Laterality Date  ? cataract surgery     ? CHOLECYSTECTOMY N/A 04/25/2021  ? Procedure: LAPAROSCOPIC CHOLECYSTECTOMY WITH INTRAOPERATIVE CHOLANGIOGRAM;  Surgeon: Armandina Gemma, MD;  Location: [facility];  Service:  General;  Laterality: N/A;  ? MOUTH SURGERY    ? OOPHORECTOMY  09/04/1996  ? RSO  ? TUBAL LIGATION    ? VAGINAL HYSTERECTOMY  09/04/1996  ? one ovary intact  ? ? ?Current Outpatient Medications  ?Medication Sig Dispense Refill  ? atorvastatin (LIPITOR) 10 MG tablet Take 5 mg by mouth daily.    ? Calcium Carbonate-Vitamin D (CALCIUM + D PO) Take 1 tablet by mouth in the morning and at bedtime.    ? cetirizine (ZYRTEC) 10 MG tablet Take 10 mg by mouth daily.    ? famotidine (PEPCID) 20 MG tablet Take 20 mg by mouth daily.    ? fluticasone (FLONASE) 50 MCG/ACT nasal spray Place 1 spray into both nostrils daily as needed for allergies.   11  ? Fluticasone Furoate (ARNUITY ELLIPTA) 100 MCG/ACT AEPB Inhale 1 puff into the lungs daily.    ? hydrochlorothiazide (HYDRODIURIL) 25 MG tablet Take 25 mg by mouth daily.  11  ? HYDROcodone-acetaminophen (NORCO/VICODIN) 5-325 MG tablet Take 1-2 tablets by mouth every 6 (six) hours as needed for  moderate pain. 12 tablet 0  ? methimazole (TAPAZOLE) 10 MG tablet Take 5 mg by mouth daily.  3  ? metoprolol tartrate (LOPRESSOR) 25 MG tablet Take 12.5 mg by mouth 2 (two) times daily as needed (Heart rhythem).    ? Multiple Vitamin (MULTIVITAMIN) tablet Take 1 tablet by mouth daily.    ? nitroGLYCERIN (NITROSTAT) 0.4 MG SL tablet Place 0.4 mg under the tongue every 5 (five) minutes as needed for chest pain.    ? Polyethyl Glycol-Propyl Glycol (SYSTANE OP) Place 1 drop into both eyes in the morning and at bedtime.    ? PROAIR HFA 108 (90 Base) MCG/ACT inhaler Inhale 2 puffs into the lungs every 6 (six) hours as needed for wheezing or shortness of breath.  1  ? ?No current facility-administered medications for this visit.  ?  ? ?ALLERGIES: Black pepper [piper], Fish allergy, Shellfish allergy, Wasp venom, Aspirin, Ibuprofen, Moxifloxacin, Penicillins, and Wool alcohol [lanolin] ? ?Family History  ?Problem Relation Age of Onset  ? Heart disease Mother   ? Hypertension Mother   ? Ovarian  cancer Mother   ? Colon cancer Mother   ? Skin cancer Mother   ? Osteoporosis Mother   ? Heart disease Brother   ? Cancer Brother   ?     Prostate cancer  ? Melanoma Brother   ? Skin cancer Brother   ? Breast cancer Maternal Aunt 80  ?     deceased 61  ? Cancer Brother   ?     Prostate cancer  ? Skin cancer Brother   ? Leukemia Father   ? Prostate cancer Father   ? Skin cancer Father   ? Skin cancer Sister   ? Other Daughter   ?     Mast Cell Disorder/Condition  ? Hypoparathyroidism Daughter   ? Thyroid disease Daughter   ? Other Daughter   ?     Mast Cell Disorder/Condition  ? Thyroid disease Daughter   ? ? ?Social History  ? ?Socioeconomic History  ? Marital status: Married  ?  Spouse name: Not on file  ? Number of children: Not on file  ? Years of education: Not on file  ? Highest education level: Not on file  ?Occupational History  ? Not on file  ?Tobacco Use  ? Smoking status: Former  ?  Passive exposure: Never  ? Smokeless tobacco: Never  ?Vaping Use  ? Vaping Use: Never used  ?Substance and Sexual Activity  ? Alcohol use: Yes  ?  Alcohol/week: 5.0 standard drinks  ?  Types: 5 Glasses of wine per week  ?  Comment: social  ? Drug use: No  ? Sexual activity: Yes  ?  Partners: Male  ?  Birth control/protection: Surgical, Post-menopausal  ?Other Topics Concern  ? Not on file  ?Social History Narrative  ? Not on file  ? ?Social Determinants of Health  ? ?Financial Resource Strain: Not on file  ?Food Insecurity: Not on file  ?Transportation Needs: Not on file  ?Physical Activity: Not on file  ?Stress: Not on file  ?Social Connections: Not on file  ?Intimate Partner Violence: Not on file  ? ? ?ROS ? ?PHYSICAL EXAMINATION:   ? ?BP 122/78 (BP Location: Right Arm, Patient Position: Sitting, Cuff Size: Normal)   Pulse 80   Ht '5\' 3"'$  (1.6 m)   Wt 164 lb (74.4 kg)   LMP 09/04/1996   BMI 29.05 kg/m?     ?General appearance: alert, cooperative  and appears stated age ? ?1. Age-related osteoporosis without current  pathological fracture ?She is getting calcium and vit d ?Exercising regularly ?Discussed reducing her risk of falls.  ?Discussed bisphosphonate, oral and IV.  ?Need a copy of her lab work from her primary ?As long as her labs are normal, will call in oral Fosamax. Reviewed side effects and risks. ?She should have a f/u DEXA in 2 years.  ? ?Over 20 minutes in total patient care.  ? ? ?

## 2021-12-21 NOTE — Patient Instructions (Signed)
Osteoporosis ? ?Osteoporosis happens when the bones become thin and less dense than normal. Osteoporosis makes bones more brittle and fragile and more likely to break (fracture). ?Over time, osteoporosis can cause your bones to become so weak that they fracture after a minor fall. Bones in the hip, wrist, and spine are most likely to fracture due to osteoporosis. ?What are the causes? ?The exact cause of this condition is not known. ?What increases the risk? ?You are more likely to develop this condition if you: ?Have family members with this condition. ?Have poor nutrition. ?Use the following: ?Steroid medicines, such as prednisone. ?Anti-seizure medicines. ?Nicotine or tobacco, such as cigarettes, e-cigarettes, and chewing tobacco. ?Are female. ?Are age 75 or older. ?Are not physically active (are sedentary). ?Are of European or Asian descent. ?Have a small body frame. ?What are the signs or symptoms? ?A fracture might be the first sign of osteoporosis, especially if the fracture results from a fall or injury that usually would not cause a bone to break. Other signs and symptoms include: ?Pain in the neck or low back. ?Stooped posture. ?Loss of height. ?How is this diagnosed? ?This condition may be diagnosed based on: ?Your medical history. ?A physical exam. ?A bone mineral density test, also called a DXA or DEXA test (dual-energy X-ray absorptiometry test). This test uses X-rays to measure the amount of minerals in your bones. ?How is this treated? ?This condition may be treated by: ?Making lifestyle changes, such as: ?Including foods with more calcium and vitamin D in your diet. ?Doing weight-bearing and muscle-strengthening exercises. ?Stopping tobacco use. ?Limiting alcohol intake. ?Taking medicine to slow the process of bone loss or to increase bone density. ?Taking daily supplements of calcium and vitamin D. ?Taking hormone replacement medicines, such as estrogen for women and testosterone for  men. ?Monitoring your levels of calcium and vitamin D. ?The goal of treatment is to strengthen your bones and lower your risk for a fracture. ?Follow these instructions at home: ?Eating and drinking ?Include calcium and vitamin D in your diet. Calcium is important for bone health, and vitamin D helps your body absorb calcium. Good sources of calcium and vitamin D include: ?Certain fatty fish, such as salmon and tuna. ?Products that have calcium and vitamin D added to them (are fortified), such as fortified cereals. ?Egg yolks. ?Cheese. ?Liver. ? ?Activity ?Do exercises as told by your health care provider. Ask your health care provider what exercises and activities are safe for you. You should do: ?Exercises that make you work against gravity (weight-bearing exercises), such as tai chi, yoga, or walking. ?Exercises to strengthen muscles, such as lifting weights. ?Lifestyle ?Do not drink alcohol if: ?Your health care provider tells you not to drink. ?You are pregnant, may be pregnant, or are planning to become pregnant. ?If you drink alcohol: ?Limit how much you use to: ?0-1 drink a day for women. ?0-2 drinks a day for men. ?Know how much alcohol is in your drink. In the [place] one drink equals one 12 oz bottle of beer (355 mL), one 5 oz glass of wine (148 mL), or one 1? oz glass of hard liquor (44 mL). ?Do not use any products that contain nicotine or tobacco, such as cigarettes, e-cigarettes, and chewing tobacco. If you need help quitting, ask your health care provider. ?Preventing falls ?Use devices to help you move around (mobility aids) as needed, such as canes, walkers, scooters, or crutches. ?Keep rooms well-lit and clutter-free. ?Remove tripping hazards from walkways, including cords and  throw rugs. ?Install grab bars in bathrooms and safety rails on stairs. ?Use rubber mats in the bathroom and other areas that are often wet or slippery. ?Wear closed-toe shoes that fit well and support your feet. Wear shoes  that have rubber soles or low heels. ?Review your medicines with your health care provider. Some medicines can cause dizziness or changes in blood pressure, which can increase your risk of falling. ?General instructions ?Take over-the-counter and prescription medicines only as told by your health care provider. ?Keep all follow-up visits. This is important. ?Contact a health care provider if: ?You have never been screened for osteoporosis and you are: ?A woman who is age 75 or older. ?A man who is age 21 or older. ?Get help right away if: ?You fall or injure yourself. ?Summary ?Osteoporosis is thinning and loss of density in your bones. This makes bones more brittle and fragile and more likely to break (fracture),even with minor falls. ?The goal of treatment is to strengthen your bones and lower your risk for a fracture. ?Include calcium and vitamin D in your diet. Calcium is important for bone health, and vitamin D helps your body absorb calcium. ?Talk with your health care provider about screening for osteoporosis if you are a woman who is age 75 or older, or a man who is age 75 or older. ?This information is not intended to replace advice given to you by your health care provider. Make sure you discuss any questions you have with your health care provider. ?Document Revised: 02/05/2020 Document Reviewed: 02/05/2020 ?Elsevier Patient Education ? [employer]. ? ?

## 2022-01-03 DIAGNOSIS — Z1231 Encounter for screening mammogram for malignant neoplasm of breast: Secondary | ICD-10-CM | POA: Diagnosis not present

## 2022-01-04 ENCOUNTER — Telehealth: Payer: Self-pay | Admitting: Obstetrics and Gynecology

## 2022-01-04 DIAGNOSIS — M810 Age-related osteoporosis without current pathological fracture: Secondary | ICD-10-CM

## 2022-01-04 NOTE — Telephone Encounter (Signed)
Spoke with patient and informed her. Appt scheduled for Friday 01/06/22 at 9:30am. ?

## 2022-01-04 NOTE — Telephone Encounter (Signed)
Please let the patient know that I got a copy of her blood work from 11/23/21. ?Everything looks fine, but she still needs a vit d. I've placed the order, please set her up for the lab visit. As long as that is normal, I will call in the Fosamax.  ?

## 2022-01-06 ENCOUNTER — Other Ambulatory Visit: Payer: BC Managed Care – PPO

## 2022-01-06 ENCOUNTER — Encounter: Payer: Self-pay | Admitting: Obstetrics and Gynecology

## 2022-01-06 DIAGNOSIS — M810 Age-related osteoporosis without current pathological fracture: Secondary | ICD-10-CM | POA: Diagnosis not present

## 2022-01-07 LAB — VITAMIN D 25 HYDROXY (VIT D DEFICIENCY, FRACTURES): Vit D, 25-Hydroxy: 32 ng/mL (ref 30–100)

## 2022-01-10 ENCOUNTER — Other Ambulatory Visit: Payer: Self-pay | Admitting: Obstetrics and Gynecology

## 2022-01-10 DIAGNOSIS — M810 Age-related osteoporosis without current pathological fracture: Secondary | ICD-10-CM

## 2022-01-10 MED ORDER — ALENDRONATE SODIUM 70 MG PO TABS: 70.0000 mg | ORAL_TABLET | ORAL | 3 refills | Status: DC

## 2022-01-25 DIAGNOSIS — H18593 Other hereditary corneal dystrophies, bilateral: Secondary | ICD-10-CM | POA: Diagnosis not present

## 2022-01-25 DIAGNOSIS — H02831 Dermatochalasis of right upper eyelid: Secondary | ICD-10-CM | POA: Diagnosis not present

## 2022-01-25 DIAGNOSIS — H25042 Posterior subcapsular polar age-related cataract, left eye: Secondary | ICD-10-CM | POA: Diagnosis not present

## 2022-01-25 DIAGNOSIS — H2512 Age-related nuclear cataract, left eye: Secondary | ICD-10-CM | POA: Diagnosis not present

## 2022-01-25 DIAGNOSIS — H25012 Cortical age-related cataract, left eye: Secondary | ICD-10-CM | POA: Diagnosis not present

## 2022-04-25 DIAGNOSIS — H2512 Age-related nuclear cataract, left eye: Secondary | ICD-10-CM | POA: Diagnosis not present

## 2022-05-04 NOTE — Progress Notes (Deleted)
76 y.o. [id number] Married White or Caucasian Not Hispanic or Latino female here for annual exam.      Patient's last menstrual period was 09/04/1996.          Sexually active: {yes no:314532}  The current method of family planning is {contraception:315051}.    Exercising: {yes no:314532}  {types:19826} Smoker:  {YES [id number]  Health Maintenance: Pap:   12-07-10 normal History of abnormal Pap:  no MMG:  01/03/22 Bi-rads 2 benign  BMD:   11/22/21 low bone mass  Colonoscopy: 2019 normal  TDaP:  up to date per patient  Gardasil: n/a   reports that she has quit smoking. She has never been exposed to tobacco smoke. She has never used smokeless tobacco. She reports current alcohol use of about 5.0 standard drinks of alcohol per week. She reports that she does not use drugs.  Past Medical History:  Diagnosis Date   Arthritis    Asthma    DUB (dysfunctional uterine bleeding)    Elevated cholesterol    Family history of adverse reaction to anesthesia    mother woke up during anesthesia and hallucinations afterwards   Family history of cancer    Fibroid    GERD (gastroesophageal reflux disease)    Headache    Hypertension    Osteopenia    Osteopenia    PONV (postoperative nausea and vomiting)    Thyroid disease    Hyperthyroid   Von Willebrand disease ([place])    hx of per pt    Past Surgical History:  Procedure Laterality Date   cataract surgery      CHOLECYSTECTOMY N/A 04/25/2021   Procedure: LAPAROSCOPIC CHOLECYSTECTOMY WITH INTRAOPERATIVE CHOLANGIOGRAM;  Surgeon: Armandina Gemma, MD;  Location: [facility];  Service: General;  Laterality: N/A;   MOUTH SURGERY     OOPHORECTOMY  09/04/1996   RSO   TUBAL LIGATION     VAGINAL HYSTERECTOMY  09/04/1996   one ovary intact    Current Outpatient Medications  Medication Sig Dispense Refill   alendronate (FOSAMAX) 70 MG tablet Take 1 tablet (70 mg total) by mouth every 7 (seven) days. Take first thing in am with 6 oz. Water.  Be upright after  taking.  Eat nothing for one hour. 12 tablet 3   atorvastatin (LIPITOR) 10 MG tablet Take 5 mg by mouth daily.     Calcium Carbonate-Vitamin D (CALCIUM + D PO) Take 1 tablet by mouth in the morning and at bedtime.     cetirizine (ZYRTEC) 10 MG tablet Take 10 mg by mouth daily.     famotidine (PEPCID) 20 MG tablet Take 20 mg by mouth daily.     fluticasone (FLONASE) 50 MCG/ACT nasal spray Place 1 spray into both nostrils daily as needed for allergies.   11   Fluticasone Furoate (ARNUITY ELLIPTA) 100 MCG/ACT AEPB Inhale 1 puff into the lungs daily.     hydrochlorothiazide (HYDRODIURIL) 25 MG tablet Take 25 mg by mouth daily.  11   HYDROcodone-acetaminophen (NORCO/VICODIN) 5-325 MG tablet Take 1-2 tablets by mouth every 6 (six) hours as needed for moderate pain. 12 tablet 0   methimazole (TAPAZOLE) 10 MG tablet Take 5 mg by mouth daily.  3   metoprolol tartrate (LOPRESSOR) 25 MG tablet Take 12.5 mg by mouth 2 (two) times daily as needed (Heart rhythem).     Multiple Vitamin (MULTIVITAMIN) tablet Take 1 tablet by mouth daily.     nitroGLYCERIN (NITROSTAT) 0.4 MG SL tablet Place 0.4 mg under the tongue  every 5 (five) minutes as needed for chest pain.     Polyethyl Glycol-Propyl Glycol (SYSTANE OP) Place 1 drop into both eyes in the morning and at bedtime.     PROAIR HFA 108 (90 Base) MCG/ACT inhaler Inhale 2 puffs into the lungs every 6 (six) hours as needed for wheezing or shortness of breath.  1   No current facility-administered medications for this visit.    Family History  Problem Relation Age of Onset   Heart disease Mother    Hypertension Mother    Ovarian cancer Mother    Colon cancer Mother    Skin cancer Mother    Osteoporosis Mother    Heart disease Brother    Cancer Brother        Prostate cancer   Melanoma Brother    Skin cancer Brother    Breast cancer Maternal Aunt 74       deceased 41   Cancer Brother        Prostate cancer   Skin cancer Brother    Leukemia Father     Prostate cancer Father    Skin cancer Father    Skin cancer Sister    Other Daughter        Mast Cell Disorder/Condition   Hypoparathyroidism Daughter    Thyroid disease Daughter    Other Daughter        Mast Cell Disorder/Condition   Thyroid disease Daughter     Review of Systems  Exam:   LMP 09/04/1996   Weight change: '@WEIGHTCHANGE'$ @ Height:      Ht Readings from Last 3 Encounters:  12/21/21 '5\' 3"'$  (1.6 m)  04/25/21 '5\' 3"'$  (1.6 m)  04/22/21 '5\' 3"'$  (1.6 m)    General appearance: alert, cooperative and appears stated age Head: Normocephalic, without obvious abnormality, atraumatic Neck: no adenopathy, supple, symmetrical, trachea midline and thyroid {CHL AMB PHY EX THYROID NORM DEFAULT:[phone]::"normal to inspection and palpation"} Lungs: clear to auscultation bilaterally Cardiovascular: regular rate and rhythm Breasts: {Exam; breast:13139::"normal appearance, no masses or tenderness"} Abdomen: soft, non-tender; non distended,  no masses,  no organomegaly Extremities: extremities normal, atraumatic, no cyanosis or edema Skin: Skin color, texture, turgor normal. No rashes or lesions Lymph nodes: Cervical, supraclavicular, and axillary nodes normal. No abnormal inguinal nodes palpated Neurologic: Grossly normal   Pelvic: External genitalia:  no lesions              Urethra:  normal appearing urethra with no masses, tenderness or lesions              Bartholins and Skenes: normal                 Vagina: normal appearing vagina with normal color and discharge, no lesions              Cervix: {CHL AMB PHY EX CERVIX NORM DEFAULT:[id number]::"no lesions"}               Bimanual Exam:  Uterus:  {CHL AMB PHY EX UTERUS NORM [phone]::"normal size, contour, position, consistency, mobility, non-tender"}              Adnexa: {CHL AMB PHY EX ADNEXA NO MASS DEFAULT:[phone]::"no mass, fullness, tenderness"}               Rectovaginal: Confirms               Anus:  normal  sphincter tone, no lesions  *** chaperoned for the exam.  A:  Well Woman with  normal exam  P:

## 2022-05-10 ENCOUNTER — Ambulatory Visit: Payer: BC Managed Care – PPO | Admitting: Obstetrics and Gynecology

## 2022-05-15 ENCOUNTER — Ambulatory Visit
Admission: RE | Admit: 2022-05-15 | Discharge: 2022-05-15 | Disposition: A | Discharge status: Home / Self Care | Payer: BC Managed Care – PPO | Source: Ambulatory Visit | Attending: Internal Medicine | Admitting: Internal Medicine

## 2022-05-15 ENCOUNTER — Other Ambulatory Visit: Payer: Self-pay | Admitting: Internal Medicine

## 2022-05-15 DIAGNOSIS — M5136 Other intervertebral disc degeneration, lumbar region: Secondary | ICD-10-CM | POA: Diagnosis not present

## 2022-05-15 DIAGNOSIS — M25552 Pain in left hip: Secondary | ICD-10-CM

## 2022-05-17 DIAGNOSIS — M545 Low back pain, unspecified: Secondary | ICD-10-CM | POA: Diagnosis not present

## 2022-05-18 ENCOUNTER — Ambulatory Visit: Payer: BC Managed Care – PPO | Admitting: Obstetrics and Gynecology

## 2022-05-22 ENCOUNTER — Other Ambulatory Visit: Payer: Self-pay | Admitting: Sports Medicine

## 2022-05-22 DIAGNOSIS — M545 Low back pain, unspecified: Secondary | ICD-10-CM

## 2022-05-24 DIAGNOSIS — J4520 Mild intermittent asthma, uncomplicated: Secondary | ICD-10-CM | POA: Diagnosis not present

## 2022-05-24 DIAGNOSIS — M545 Low back pain, unspecified: Secondary | ICD-10-CM | POA: Diagnosis not present

## 2022-05-24 DIAGNOSIS — I10 Essential (primary) hypertension: Secondary | ICD-10-CM | POA: Diagnosis not present

## 2022-05-24 DIAGNOSIS — K219 Gastro-esophageal reflux disease without esophagitis: Secondary | ICD-10-CM | POA: Diagnosis not present

## 2022-06-07 ENCOUNTER — Other Ambulatory Visit: Payer: BC Managed Care – PPO

## 2022-06-21 NOTE — Progress Notes (Signed)
76 y.o. [id number] Married White or Caucasian Not Hispanic or Latino female here for annual exam.   H/O TVH/RSO. Sexually active, uses a lubricant, helps some.   No bowel or bladder issues. She has mild GSI.   She was diagnosed with osteoporosis in earlier this year and started Fosamax in 5/23, tolerating it fine. She is taking calcium and vit d 2 x a day.     Patient's last menstrual period was 09/04/1996.          Sexually active: Yes.    The current method of family planning is status post hysterectomy.    Exercising: Yes.     She is doing PT x2 week  Smoker:  no  Health Maintenance: Pap:  12-07-10 normal History of abnormal Pap:  no MMG:  01/03/22 Bi-rads 2 benign  BMD:   11/22/21 osteoporosis, T score -1.8, FRAX 18/4% Colonoscopy: 2019 normal TDaP:  UTD Gardasil: N/A   reports that she has quit smoking. She has never been exposed to tobacco smoke. She has never used smokeless tobacco. She reports current alcohol use of about 5.0 standard drinks of alcohol per week. She reports that she does not use drugs. She is a retired Chief Executive Officer. Husband is 66, an Dietitian (considering retirement). She has 2 daughters, 4 grandchildren. Mom died last month 2 days prior to her 73 birthday.    Past Medical History:  Diagnosis Date   Arthritis    Asthma    DUB (dysfunctional uterine bleeding)    Elevated cholesterol    Family history of adverse reaction to anesthesia    mother woke up during anesthesia and hallucinations afterwards   Family history of cancer    Fibroid    GERD (gastroesophageal reflux disease)    Headache    Hypertension    Osteopenia    Osteopenia    PONV (postoperative nausea and vomiting)    Thyroid disease    Hyperthyroid   Von Willebrand disease ([place])    hx of per pt    Past Surgical History:  Procedure Laterality Date   cataract surgery      CHOLECYSTECTOMY N/A 04/25/2021   Procedure: LAPAROSCOPIC CHOLECYSTECTOMY WITH INTRAOPERATIVE CHOLANGIOGRAM;   Surgeon: Armandina Gemma, MD;  Location: [facility];  Service: General;  Laterality: N/A;   MOUTH SURGERY     OOPHORECTOMY  09/04/1996   RSO   TUBAL LIGATION     VAGINAL HYSTERECTOMY  09/04/1996   one ovary intact    Current Outpatient Medications  Medication Sig Dispense Refill   alendronate (FOSAMAX) 70 MG tablet Take 1 tablet (70 mg total) by mouth every 7 (seven) days. Take first thing in am with 6 oz. Water.  Be upright after taking.  Eat nothing for one hour. 12 tablet 3   atorvastatin (LIPITOR) 10 MG tablet Take 5 mg by mouth daily.     Calcium Carbonate-Vitamin D (CALCIUM + D PO) Take 1 tablet by mouth in the morning and at bedtime.     cetirizine (ZYRTEC) 10 MG tablet Take 10 mg by mouth daily.     famotidine (PEPCID) 20 MG tablet Take 20 mg by mouth daily.     fluticasone (FLONASE) 50 MCG/ACT nasal spray Place 1 spray into both nostrils daily as needed for allergies.   11   Fluticasone Furoate (ARNUITY ELLIPTA) 100 MCG/ACT AEPB Inhale 1 puff into the lungs daily.     hydrochlorothiazide (HYDRODIURIL) 25 MG tablet Take 25 mg by mouth daily.  [place]  methimazole (TAPAZOLE) 10 MG tablet Take 5 mg by mouth daily.  3   Multiple Vitamin (MULTIVITAMIN) tablet Take 1 tablet by mouth daily.     PROAIR HFA 108 (90 Base) MCG/ACT inhaler Inhale 2 puffs into the lungs every 6 (six) hours as needed for wheezing or shortness of breath.  1   metoprolol tartrate (LOPRESSOR) 25 MG tablet Take 12.5 mg by mouth 2 (two) times daily as needed (Heart rhythem). (Patient not taking: Reported on 06/28/2022)     nitroGLYCERIN (NITROSTAT) 0.4 MG SL tablet Place 0.4 mg under the tongue every 5 (five) minutes as needed for chest pain. (Patient not taking: Reported on 06/28/2022)     Polyethyl Glycol-Propyl Glycol (SYSTANE OP) Place 1 drop into both eyes in the morning and at bedtime. (Patient not taking: Reported on 06/28/2022)     No current facility-administered medications for this visit.    Family History   Problem Relation Age of Onset   Heart disease Mother    Hypertension Mother    Ovarian cancer Mother    Colon cancer Mother    Skin cancer Mother    Osteoporosis Mother    Heart disease Brother    Cancer Brother        Prostate cancer   Melanoma Brother    Skin cancer Brother    Breast cancer Maternal Aunt 38       deceased 48   Cancer Brother        Prostate cancer   Skin cancer Brother    Leukemia Father    Prostate cancer Father    Skin cancer Father    Skin cancer Sister    Other Daughter        Mast Cell Disorder/Condition   Hypoparathyroidism Daughter    Thyroid disease Daughter    Other Daughter        Mast Cell Disorder/Condition   Thyroid disease Daughter   2 brothers with prostate cancer Mom with ovarian cancer at 105, colon cancer in her early 23's 2 maternal aunts with breast cancer Maternal cousin with breast cancer She was seen by Genetics ~15 years ago, they didn't recommend any genetic testing given the age of her relatives.   Review of Systems  All other systems reviewed and are negative.   Exam:   BP 128/70   Ht '5\' 3"'$  (1.6 m)   Wt 163 lb (73.9 kg)   LMP 09/04/1996   BMI 28.87 kg/m   Weight change: '@WEIGHTCHANGE'$ @ Height:   Height: '5\' 3"'$  (160 cm)  Ht Readings from Last 3 Encounters:  06/28/22 '5\' 3"'$  (1.6 m)  12/21/21 '5\' 3"'$  (1.6 m)  04/25/21 '5\' 3"'$  (1.6 m)    General appearance: alert, cooperative and appears stated age Head: Normocephalic, without obvious abnormality, atraumatic Neck: no adenopathy, supple, symmetrical, trachea midline and thyroid normal to inspection and palpation Breasts: normal appearance, no masses or tenderness Abdomen: soft, non-tender; non distended,  no masses,  no organomegaly Extremities: extremities normal, atraumatic, no cyanosis or edema Skin: Skin color, texture, turgor normal. No rashes or lesions Lymph nodes: Cervical, supraclavicular, and axillary nodes normal. No abnormal inguinal nodes palpated Neurologic:  Grossly normal   Pelvic: External genitalia:  no lesions              Urethra:  normal appearing urethra with no masses, tenderness or lesions              Bartholins and Skenes: normal  Vagina: normal appearing vagina with normal color and discharge, no lesions              Cervix: absent               Bimanual Exam:  Uterus:  uterus absent              Adnexa: no mass, fullness, tenderness               Rectovaginal: Confirms               Anus:  normal sphincter tone, no lesions  Gae Dry, CMA chaperoned for the exam.  1. Encounter for breast and pelvic examination Discussed breast self exam Discussed calcium and vit D intake No pap needed Colonoscopy and mammogram UTD Labs with primary  2. Family history of ovarian cancer Desires CA 125, understands risks/benefits of testing and that it isn't standard of care - CA 125 - Ambulatory referral to Genetics  3. Family history of colon cancer - Ambulatory referral to Genetics  4. Family history of melanoma - Ambulatory referral to [facility]

## 2022-06-28 ENCOUNTER — Encounter: Payer: Self-pay | Admitting: Obstetrics and Gynecology

## 2022-06-28 ENCOUNTER — Ambulatory Visit (INDEPENDENT_AMBULATORY_CARE_PROVIDER_SITE_OTHER): Payer: Medicare Other | Admitting: Obstetrics and Gynecology

## 2022-06-28 ENCOUNTER — Telehealth: Payer: Self-pay | Admitting: Genetic Counselor

## 2022-06-28 VITALS — BP 128/70 | Ht 63.0 in | Wt 163.0 lb

## 2022-06-28 DIAGNOSIS — M810 Age-related osteoporosis without current pathological fracture: Secondary | ICD-10-CM | POA: Diagnosis not present

## 2022-06-28 DIAGNOSIS — Z800 Family history of malignant neoplasm of digestive organs: Secondary | ICD-10-CM

## 2022-06-28 DIAGNOSIS — Z808 Family history of malignant neoplasm of other organs or systems: Secondary | ICD-10-CM | POA: Diagnosis not present

## 2022-06-28 DIAGNOSIS — Z01419 Encounter for gynecological examination (general) (routine) without abnormal findings: Secondary | ICD-10-CM

## 2022-06-28 DIAGNOSIS — Z8041 Family history of malignant neoplasm of ovary: Secondary | ICD-10-CM

## 2022-06-28 NOTE — Patient Instructions (Signed)

## 2022-06-28 NOTE — Telephone Encounter (Signed)
Scheduled appt per 10/25 referral. Pt is aware of appt date and time. Pt is aware to arrive 15 mins prior to appt time and to bring and updated insurance card. Pt is aware of appt location.   

## 2022-06-29 LAB — CA 125: CA 125: 9 U/mL (ref <35)

## 2022-09-01 ENCOUNTER — Other Ambulatory Visit: Payer: Self-pay | Admitting: Genetic Counselor

## 2022-09-01 DIAGNOSIS — Z809 Family history of malignant neoplasm, unspecified: Secondary | ICD-10-CM

## 2022-09-05 ENCOUNTER — Encounter: Payer: Self-pay | Admitting: Genetic Counselor

## 2022-09-06 ENCOUNTER — Encounter: Payer: Self-pay | Admitting: Genetic Counselor

## 2022-09-06 ENCOUNTER — Inpatient Hospital Stay: Payer: Medicare Other | Attending: Genetic Counselor | Admitting: Genetic Counselor

## 2022-09-06 ENCOUNTER — Other Ambulatory Visit: Payer: Self-pay

## 2022-09-06 ENCOUNTER — Inpatient Hospital Stay: Payer: Medicare Other

## 2022-09-06 DIAGNOSIS — Z8042 Family history of malignant neoplasm of prostate: Secondary | ICD-10-CM

## 2022-09-06 DIAGNOSIS — Z8041 Family history of malignant neoplasm of ovary: Secondary | ICD-10-CM

## 2022-09-06 DIAGNOSIS — Z800 Family history of malignant neoplasm of digestive organs: Secondary | ICD-10-CM

## 2022-09-06 DIAGNOSIS — Z809 Family history of malignant neoplasm, unspecified: Secondary | ICD-10-CM

## 2022-09-06 DIAGNOSIS — Z8049 Family history of malignant neoplasm of other genital organs: Secondary | ICD-10-CM

## 2022-09-06 LAB — GENETIC SCREENING ORDER

## 2022-09-06 NOTE — Progress Notes (Signed)
REFERRING PROVIDER: Salvadore Dom, [address] [place],  [place] [address]  PRIMARY PROVIDER:  Lavone Orn, MD  PRIMARY REASON FOR VISIT:  1. Family history of colon cancer   2. Family history of ovarian cancer   3. Family history of uterine cancer   4. Family history of prostate cancer      HISTORY OF PRESENT ILLNESS:   Ms. Schwartzkopf, a 77 y.o. female, was seen for a [place] cancer genetics consultation at the request of Dr. Talbert Nan due to a family history of cancer.  Ms. Seyller presents to clinic today to discuss the possibility of a hereditary predisposition to cancer, genetic testing, and to further clarify her future cancer risks, as well as potential cancer risks for family members.   Ms. Albert is a 77 y.o. female with no personal history of cancer.    CANCER HISTORY:  Oncology History   No history exists.     RISK FACTORS:  Menarche was at age 62.  First live birth at age 33.  OCP use for approximately 3 years.  Ovaries intact: yes.  Hysterectomy: yes.  Menopausal status: postmenopausal.  HRT use: 2 years. Colonoscopy: yes; normal. Mammogram within the last year: yes. Number of breast biopsies: 0. Up to date with pelvic exams: yes. Any excessive radiation exposure in the past: no  Past Medical History:  Diagnosis Date   Arthritis    Asthma    DUB (dysfunctional uterine bleeding)    Elevated cholesterol    Family history of adverse reaction to anesthesia    mother woke up during anesthesia and hallucinations afterwards   Family history of cancer    Family history of colon cancer    Family history of ovarian cancer    Family history of prostate cancer    Family history of uterine cancer    Fibroid    GERD (gastroesophageal reflux disease)    Headache    Hypertension    Osteopenia    Osteopenia    PONV (postoperative nausea and vomiting)    Thyroid disease    Hyperthyroid   Von Willebrand disease ([place])    hx of per pt     Past Surgical History:  Procedure Laterality Date   cataract surgery      CHOLECYSTECTOMY N/A 04/25/2021   Procedure: LAPAROSCOPIC CHOLECYSTECTOMY WITH INTRAOPERATIVE CHOLANGIOGRAM;  Surgeon: Armandina Gemma, MD;  Location: [facility];  Service: General;  Laterality: N/A;   MOUTH SURGERY     OOPHORECTOMY  09/04/1996   RSO   TUBAL LIGATION     VAGINAL HYSTERECTOMY  09/04/1996   one ovary intact    Social History   Socioeconomic History   Marital status: Married    Spouse name: Not on file   Number of children: Not on file   Years of education: Not on file   Highest education level: Not on file  Occupational History   Not on file  Tobacco Use   Smoking status: Former    Passive exposure: Never   Smokeless tobacco: Never  Vaping Use   Vaping Use: Never used  Substance and Sexual Activity   Alcohol use: Yes    Alcohol/week: 5.0 standard drinks of alcohol    Types: 5 Glasses of wine per week    Comment: social   Drug use: No   Sexual activity: Yes    Partners: Male    Birth control/protection: Surgical, Post-menopausal  Other Topics Concern   Not on file  Social History Narrative   Not on file   Social Determinants of Health   Financial Resource Strain: Not on file  Food Insecurity: Not on file  Transportation Needs: Not on file  Physical Activity: Not on file  Stress: Not on file  Social Connections: Not on file     FAMILY HISTORY:  We obtained a detailed, 4-generation family history.  Significant diagnoses are listed below: Family History  Problem Relation Age of Onset   Heart disease Mother    Hypertension Mother    Ovarian cancer Mother 43   Colon cancer Mother 34   Skin cancer Mother    Osteoporosis Mother    Uterine cancer Mother 69   Leukemia Father    Prostate cancer Father        dx 64s   Skin cancer Father    Skin cancer Sister    Prostate cancer Brother        dx 32s   Heart disease Brother    Melanoma Brother        dx 58s   Skin cancer  Brother    Prostate cancer Brother        dx 12s   Skin cancer Brother    Breast cancer Maternal Aunt 80       deceased 82   Breast cancer Maternal Aunt        dx 13s   Dementia Maternal Grandmother    Lung cancer Maternal Grandfather    Uterine cancer Paternal Grandmother    Prostate cancer Paternal Grandfather    Other Daughter        Mast Cell Disorder/Condition   Hypoparathyroidism Daughter    Thyroid disease Daughter    Other Daughter        Mast Cell Disorder/Condition   Thyroid disease Daughter    Ovarian cancer Other    Colon cancer Other    Colon cancer Nephew 22     The patient has two daughters who are cancer free.  She has a sister and two brothers.  One brother has had prostate cancer and melanoma, another brother had prostate cancer and skin cancer and her sister had skin cancer.  The sister has a son who had colon cancer at 17.  Both parents are deceased.  The patient's mother had uterine cancer at 85 and colon and ovarian cancer at 57.  She reportedly had genetic testing 15-20 years ago and was found to have a BRCA VUS.  She had two sisters who each had breast cancer and one sister had a daughter with an unknown cancer.  The maternal grandparents are deceased.  The grandmother had a sister with ovarian cancer, a sister with colon cancer and a sister who had a daughter with breast cancer.  The patient's father had prostate cancer in his 40's and leukemia.  He had two sisters who were cancer free.  His mother had uterine cancer and his father had prostate cancer.  Ms. Grulke is aware of previous family history of genetic testing for hereditary cancer risks. Patient's maternal ancestors are of [place] and [place] descent, and paternal ancestors are of [place] descent. There is no reported Ashkenazi Jewish ancestry. There is no known consanguinity.  GENETIC COUNSELING ASSESSMENT: Ms. Stefanski is a 77 y.o. female with a family history of cancer which is somewhat suggestive of a  hereditary cancer syndrome such as Lynch syndrome and predisposition to cancer given the combination of cancers and young ages of onset. We, therefore, discussed and  recommended the following at today's visit.   DISCUSSION: We discussed that, in general, most cancer is not inherited in families, but instead is sporadic or familial. Sporadic cancers occur by chance and typically happen at older ages (>50 years) as this type of cancer is caused by genetic changes acquired during an individual's lifetime. Some families have more cancers than would be expected by chance; however, the ages or types of cancer are not consistent with a known genetic mutation or known genetic mutations have been ruled out. This type of familial cancer is thought to be due to a combination of multiple genetic, environmental, hormonal, and lifestyle factors. While this combination of factors likely increases the risk of cancer, the exact source of this risk is not currently identifiable or testable.  We discussed that 5 - 7% of colon cancer and 3-5% of uterine cancer is hereditary, with most cases associated with Lynch syndrome.  There are other genes that can be associated with hereditary colon cancer syndromes.  These include APC and MUTYH.  We discussed that testing is beneficial for several reasons including knowing how to follow individuals after completing their treatment, identifying whether potential treatment options such as PARP inhibitors would be beneficial, and understand if other family members could be at risk for cancer and allow them to undergo genetic testing.   We reviewed the characteristics, features and inheritance patterns of hereditary cancer syndromes. We also discussed genetic testing, including the appropriate family members to test, the process of testing, insurance coverage and turn-around-time for results. We discussed the implications of a negative, positive, carrier and/or variant of uncertain significant  result. Ms. Rubalcava  was offered a common hereditary cancer panel (47 genes) and an expanded pan-cancer panel (77 genes). Ms. Furber was informed of the benefits and limitations of each panel, including that expanded pan-cancer panels contain genes that do not have clear management guidelines at this point in time.  We also discussed that as the number of genes included on a panel increases, the chances of variants of uncertain significance increases. Ms. Rogoff decided to pursue genetic testing for the CancerNext-Expanded+RNAinsight gene panel.   The CancerNext-Expanded gene panel offered by [facility] and includes sequencing and rearrangement analysis for the following 77 genes: AIP, ALK, APC*, ATM*, AXIN2, BAP1, BARD1, BLM, BMPR1A, BRCA1*, BRCA2*, BRIP1*, CDC73, CDH1*, CDK4, CDKN1B, CDKN2A, CHEK2*, CTNNA1, DICER1, FANCC, FH, FLCN, GALNT12, KIF1B, LZTR1, MAX, MEN1, MET, MLH1*, MSH2*, MSH3, MSH6*, MUTYH*, NBN, NF1*, NF2, NTHL1, PALB2*, PHOX2B, PMS2*, POT1, PRKAR1A, PTCH1, PTEN*, RAD51C*, RAD51D*, RB1, RECQL, RET, SDHA, SDHAF2, SDHB, SDHC, SDHD, SMAD4, SMARCA4, SMARCB1, SMARCE1, STK11, SUFU, TMEM127, TP53*, TSC1, TSC2, VHL and XRCC2 (sequencing and deletion/duplication); EGFR, EGLN1, HOXB13, KIT, MITF, PDGFRA, POLD1, and POLE (sequencing only); EPCAM and GREM1 (deletion/duplication only). DNA and RNA analyses performed for * genes.   Based on Ms. Lengyel's family history of cancer, she meets medical criteria for genetic testing. Despite that she meets criteria, she may still have an out of pocket cost. We discussed that if her out of pocket cost for testing is over $100, the laboratory will call and confirm whether she wants to proceed with testing.  If the out of pocket cost of testing is less than $100 she will be billed by the genetic testing laboratory.   We discussed that some people do not want to undergo genetic testing due to fear of genetic discrimination.  A federal law called the Genetic Information  Non-Discrimination Act (GINA) of 2008 helps protect individuals against genetic  discrimination based on their genetic test results.  It impacts both health insurance and employment.  With health insurance, it protects against increased premiums, being kicked off insurance or being forced to take a test in order to be insured.  For employment it protects against hiring, firing and promoting decisions based on genetic test results.  GINA does not apply to those in the [employer], those who work for companies with less than 15 employees, and new life insurance or long-term disability insurance policies.  Health status due to a cancer diagnosis is not protected under GINA.    PLAN: After considering the risks, benefits, and limitations, Ms. Messenger provided informed consent to pursue genetic testing and the blood sample was sent to [facility] for analysis of the CancerNext-Expanded+RNAinsight. Results should be available within approximately 2-3 weeks' time, at which point they will be disclosed by telephone to Ms. Haertel, as will any additional recommendations warranted by these results. Ms. Haupt will receive a summary of her genetic counseling visit and a copy of her results once available. This information will also be available in Epic.   Based on Ms. Eagleton's family history, we recommended her nephew, who was diagnosed with colon cancer at age 62, have genetic counseling and testing. Ms. Glaeser will let us know if we can be of any assistance in coordinating genetic counseling and/or testing for this family member.   Lastly, we encouraged Ms. Hise to remain in contact with cancer genetics annually so that we can continuously update the family history and inform her of any changes in cancer genetics and testing that may be of benefit for this family.   Ms. Birt questions were answered to her satisfaction today. Our contact information was provided should additional questions or concerns arise.  Thank you for the referral and allowing [place] to share in the care of your patient.   Ruffus Kamaka P. Florene Glen, [place], [facility] Licensed, Insurance risk surveyor [email or web address] phone: [phone]  The patient was seen for a total of 35 minutes in face-to-face genetic counseling.  The patient was seen alone.  Drs. Michell Heinrich, and/or [place] were available for questions, if needed..    _______________________________________________________________________ For Office Staff:  Number of people involved in session: 1 Was an Intern/ student involved with case: no

## 2022-09-15 ENCOUNTER — Encounter: Payer: Self-pay | Admitting: Genetic Counselor

## 2022-09-15 ENCOUNTER — Telehealth: Payer: Self-pay | Admitting: Genetic Counselor

## 2022-09-15 ENCOUNTER — Ambulatory Visit: Payer: Self-pay | Admitting: Genetic Counselor

## 2022-09-15 DIAGNOSIS — Z1379 Encounter for other screening for genetic and chromosomal anomalies: Secondary | ICD-10-CM

## 2022-09-15 NOTE — Progress Notes (Unsigned)
error 

## 2022-09-15 NOTE — Telephone Encounter (Signed)
Revealed negative genetic testing.  Discussed that we do not know why there is cancer in the family. It could be due to a different gene that we are not testing, or maybe our current technology may not be able to pick something up.  It will be important for her to keep in contact with genetics to keep up with whether additional testing may be needed.  

## 2022-09-18 ENCOUNTER — Ambulatory Visit: Payer: Self-pay | Admitting: Genetic Counselor

## 2022-09-18 DIAGNOSIS — Z1379 Encounter for other screening for genetic and chromosomal anomalies: Secondary | ICD-10-CM

## 2022-09-18 NOTE — Progress Notes (Signed)
HPI:  [name] was previously seen in the [facility] due to a family history of cancer and concerns regarding a hereditary predisposition to cancer. Please refer to our prior cancer genetics clinic note for more information regarding our discussion, assessment and recommendations, at the time. [name] recent genetic test results were disclosed to her, as were recommendations warranted by these results. These results and recommendations are discussed in more detail below.  CANCER HISTORY:  Oncology History   No history exists.    FAMILY HISTORY:  We obtained a detailed, 4-generation family history.  Significant diagnoses are listed below: Family History  Problem Relation Age of Onset   Heart disease Mother    Hypertension Mother    Ovarian cancer Mother 41   Colon cancer Mother 81   Skin cancer Mother    Osteoporosis Mother    Uterine cancer Mother 81   Leukemia Father    Prostate cancer Father        dx 42s   Skin cancer Father    Skin cancer Sister    Prostate cancer Brother        dx 32s   Heart disease Brother    Melanoma Brother        dx 35s   Skin cancer Brother    Prostate cancer Brother        dx 47s   Skin cancer Brother    Breast cancer Maternal Aunt 80       deceased 79   Breast cancer Maternal Aunt        dx 38s   Dementia Maternal Grandmother    Lung cancer Maternal Grandfather    Uterine cancer Paternal Grandmother    Prostate cancer Paternal Grandfather    Other Daughter        Mast Cell Disorder/Condition   Hypoparathyroidism Daughter    Thyroid disease Daughter    Other Daughter        Mast Cell Disorder/Condition   Thyroid disease Daughter    Ovarian cancer Other    Colon cancer Other    Colon cancer Nephew 3       The patient has two daughters who are cancer free.  She has a sister and two brothers.  One brother has had prostate cancer and melanoma, another brother had prostate cancer and skin cancer and her sister  had skin cancer.  The sister has a son who had colon cancer at 68.  Both parents are deceased.   The patient's mother had uterine cancer at 76 and colon and ovarian cancer at 50.  She reportedly had genetic testing 15-20 years ago and was found to have a BRCA VUS.  She had two sisters who each had breast cancer and one sister had a daughter with an unknown cancer.  The maternal grandparents are deceased.  The grandmother had a sister with ovarian cancer, a sister with colon cancer and a sister who had a daughter with breast cancer.   The patient's father had prostate cancer in his 58's and leukemia.  He had two sisters who were cancer free.  His mother had uterine cancer and his father had prostate cancer.   [name] is aware of previous family history of genetic testing for hereditary cancer risks. Patient's maternal ancestors are of [place] and [place] descent, and paternal ancestors are of [place] descent. There is no reported Ashkenazi Jewish ancestry. There is no known consanguinity  GENETIC TEST RESULTS: Genetic testing reported out  on Januay 11, 2024 through the CancerNext-Expanded+RNAinsight cancer panel found no pathogenic mutations. The CancerNext-Expanded gene panel offered by [facility] and includes sequencing and rearrangement analysis for the following 77 genes: AIP, ALK, APC*, ATM*, AXIN2, BAP1, BARD1, BLM, BMPR1A, BRCA1*, BRCA2*, BRIP1*, CDC73, CDH1*, CDK4, CDKN1B, CDKN2A, CHEK2*, CTNNA1, DICER1, FANCC, FH, FLCN, GALNT12, KIF1B, LZTR1, MAX, MEN1, MET, MLH1*, MSH2*, MSH3, MSH6*, MUTYH*, NBN, NF1*, NF2, NTHL1, PALB2*, PHOX2B, PMS2*, POT1, PRKAR1A, PTCH1, PTEN*, RAD51C*, RAD51D*, RB1, RECQL, RET, SDHA, SDHAF2, SDHB, SDHC, SDHD, SMAD4, SMARCA4, SMARCB1, SMARCE1, STK11, SUFU, TMEM127, TP53*, TSC1, TSC2, VHL and XRCC2 (sequencing and deletion/duplication); EGFR, EGLN1, HOXB13, KIT, MITF, PDGFRA, POLD1, and POLE (sequencing only); EPCAM and GREM1 (deletion/duplication only). DNA and RNA analyses  performed for * genes. The test report has been scanned into EPIC and is located under the Molecular Pathology section of the Results Review tab.  A portion of the result report is included below for reference.     We discussed with [name] that because current genetic testing is not perfect, it is possible there may be a gene mutation in one of these genes that current testing cannot detect, but that chance is small.  We also discussed, that there could be another gene that has not yet been discovered, or that we have not yet tested, that is responsible for the cancer diagnoses in the family. It is also possible there is a hereditary cause for the cancer in the family that [name] did not inherit and therefore was not identified in her testing.  Therefore, it is important to remain in touch with cancer genetics in the future so that we can continue to offer [name] the most up to date genetic testing.   ADDITIONAL GENETIC TESTING: We discussed with [name] that her genetic testing was fairly extensive.  If there are genes identified to increase cancer risk that can be analyzed in the future, we would be happy to discuss and coordinate this testing at that time.    CANCER SCREENING RECOMMENDATIONS: [name] test result is considered negative (normal).  This means that we have not identified a hereditary cause for her family history of cancer at this time. Most cancers happen by chance and this negative test suggests that her cancer may fall into this category.    While reassuring, this does not definitively rule out a hereditary predisposition to cancer. It is still possible that there could be genetic mutations that are undetectable by current technology. There could be genetic mutations in genes that have not been tested or identified to increase cancer risk.  Therefore, it is recommended she continue to follow the cancer management and screening guidelines provided by her primary healthcare  provider.   An individual's cancer risk and medical management are not determined by genetic test results alone. Overall cancer risk assessment incorporates additional factors, including personal medical history, family history, and any available genetic information that may result in a personalized plan for cancer prevention and surveillance  RECOMMENDATIONS FOR FAMILY MEMBERS:  Individuals in this family might be at some increased risk of developing cancer, over the general population risk, simply due to the family history of cancer.  We recommended women in this family have a yearly mammogram beginning at age 69, or 80 years younger than the earliest onset of cancer, an annual clinical breast exam, and perform monthly breast self-exams. Women in this family should also have a gynecological exam as recommended by their primary provider. All family members should  be referred for colonoscopy starting at age 39.  It is also possible there is a hereditary cause for the cancer in [name]'s family that she did not inherit and therefore was not identified in her.  Based on [name]'s family history, we recommended her nephew, who was diagnosed with colon cancer at age 64, have genetic counseling and testing. [name] will let us know if we can be of any assistance in coordinating genetic counseling and/or testing for this family member.   FOLLOW-UP: Lastly, we discussed with [name] that cancer genetics is a rapidly advancing field and it is possible that new genetic tests will be appropriate for her and/or her family members in the future. We encouraged her to remain in contact with cancer genetics on an annual basis so we can update her personal and family histories and let her know of advances in cancer genetics that may benefit this family.   Our contact number was provided. [name] questions were answered to her satisfaction, and she knows she is welcome to call us at anytime with additional  questions or concerns.   Roma Kayser, [place], [facility] Licensed, Certified Genetic Counselor [email or web address]

## 2022-12-02 IMAGING — CT CT CARDIAC CORONARY ARTERY CALCIUM SCORE
3 series · 14 of 20 positions shown, 16 images · non-contrast
Comparison: None.

CLINICAL DATA: 76-year-old Caucasian female with history of
hypertension, hyperlipidemia and family history of heart disease.

EXAM:
CT CARDIAC CORONARY ARTERY CALCIUM SCORE
TECHNIQUE: Non-contrast imaging through the heart was performed using
prospective ECG gating. Image post processing was performed on an
independent workstation, allowing for quantitative analysis of the
heart and coronary arteries. Note that this exam targets the heart
and the chest was not imaged in its entirety.

[Series 2: calcium scoring 2.00 qr36 bestdiast 69% hrt calciu · axial · 0.39mm/px · z∈[+1390,+1462]mm · 4 of 62 slices shown]
[im 13/62  vessel]
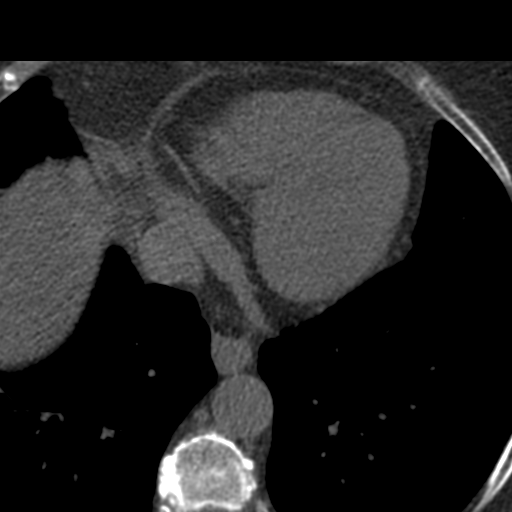
[im 25/62  vessel]
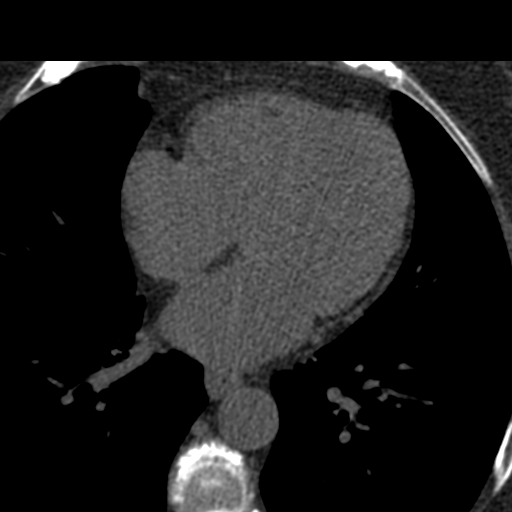
[im 37/62  vessel]
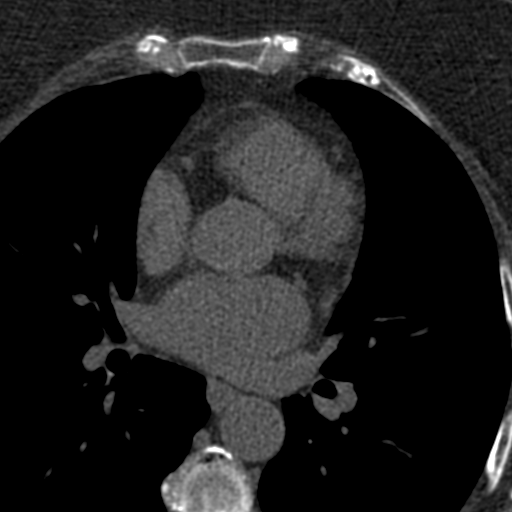
[im 49/62  vessel]
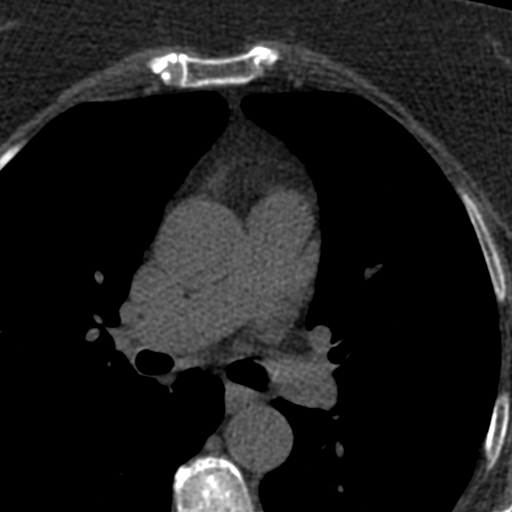

[Series 3: calcium scoring 2.00 br40 bestdiast 69% axial · axial · 0.61mm/px · z∈[+1388,+1468]mm · 5 of 61 slices shown, 7 images]
[im 11/61  vessel]
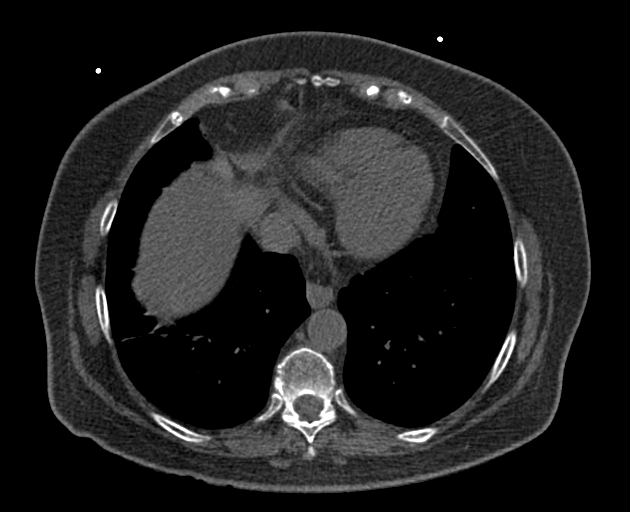
[im 11/61  lung]
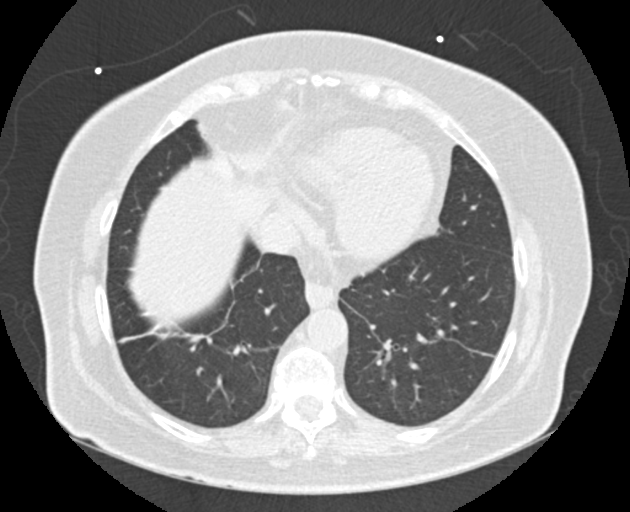
[im 21/61  vessel]
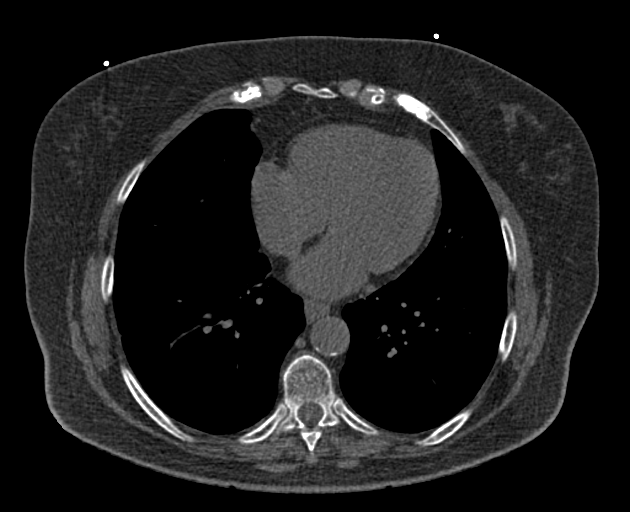
[im 31/61  vessel]
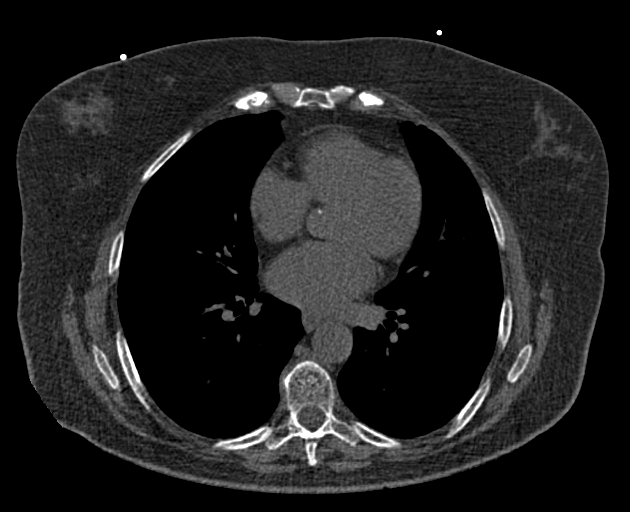
[im 41/61  vessel]
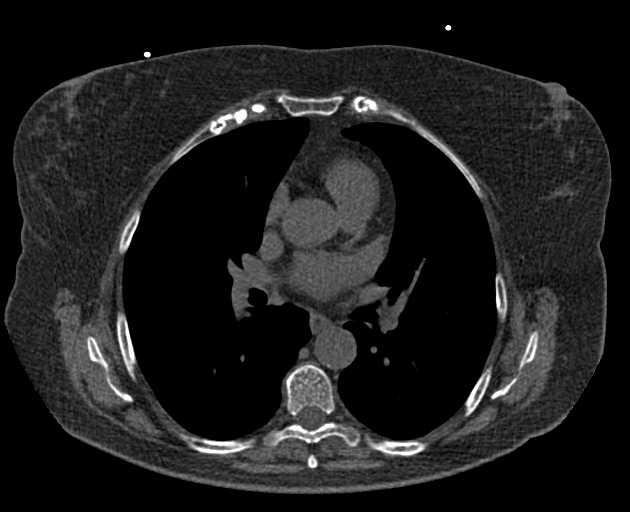
[im 51/61  vessel]
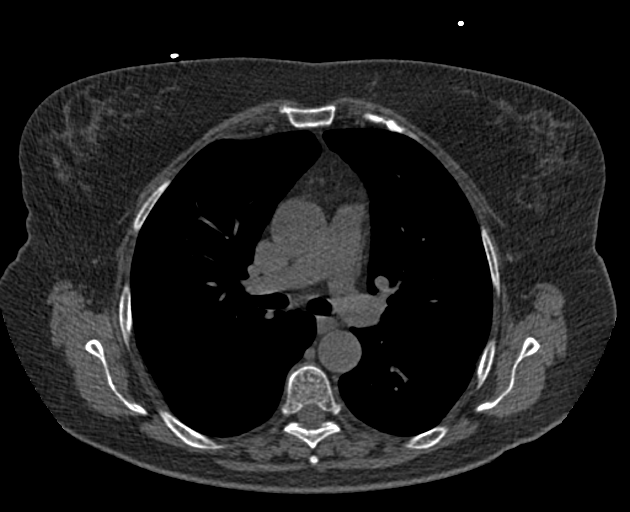
[im 51/61  lung]
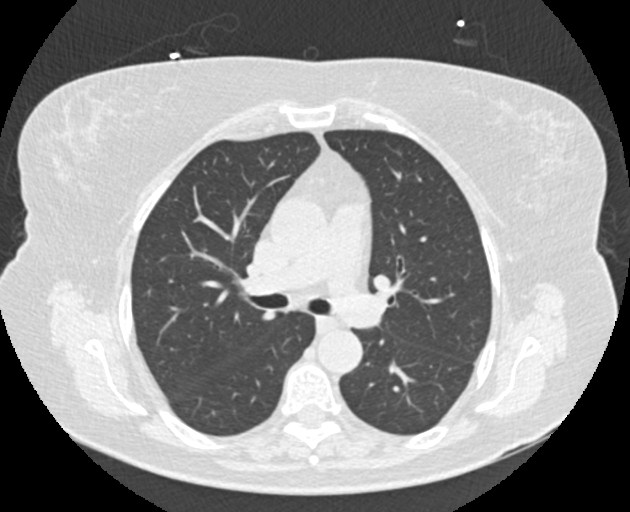

[Series 9: calcium scoring 2.00 br60 bestdiast 69% lungs · axial · 0.61mm/px · z∈[+1388,+1468]mm · 5 of 61 slices shown]
[im 11/61  vessel]
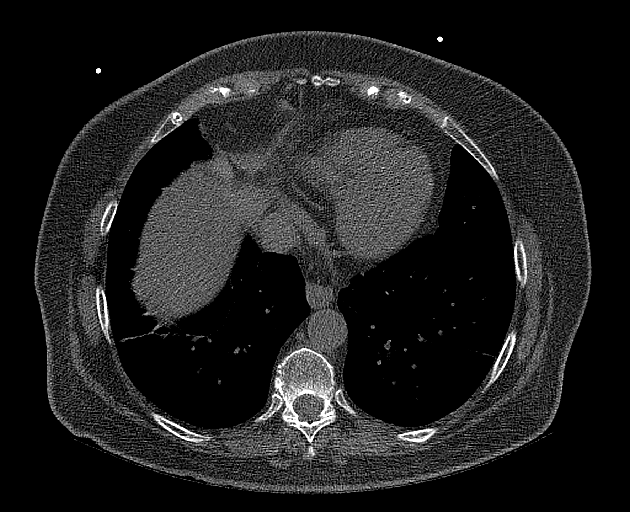
[im 21/61  vessel]
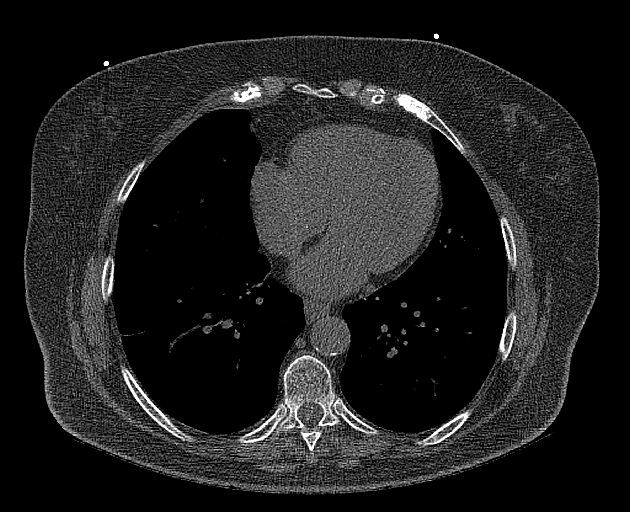
[im 31/61  vessel]
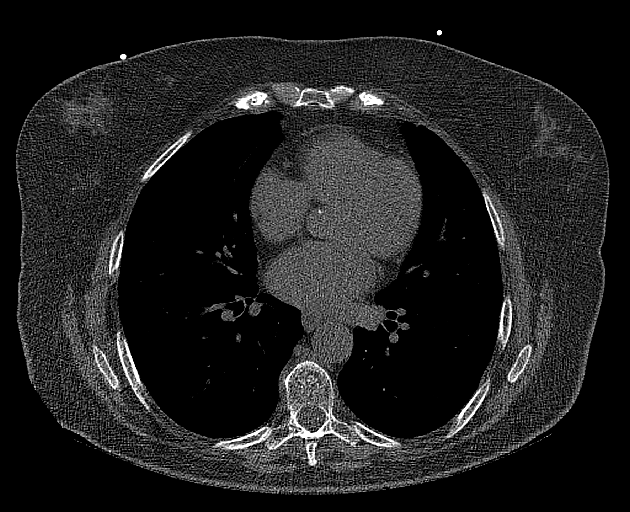
[im 41/61  vessel]
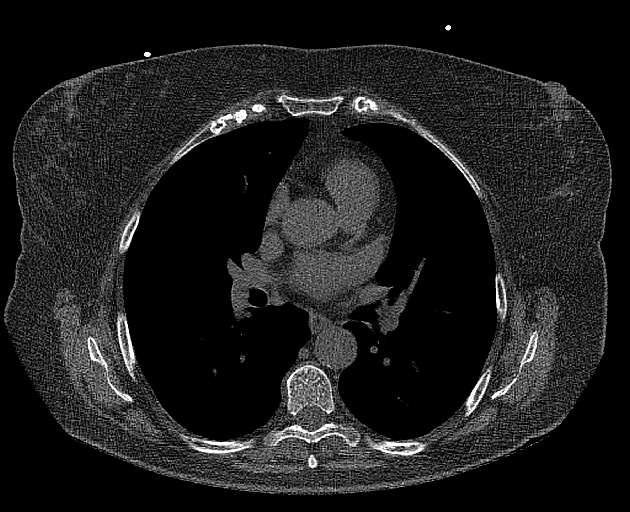
[im 51/61  vessel]
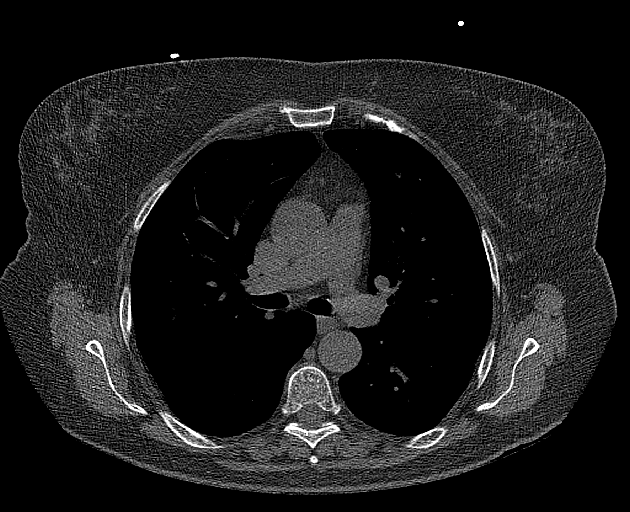

[14 of 20 positions shown; findings below may reference images not displayed]

FINDINGS: CORONARY CALCIUM SCORES:

Left Main: 0

LAD: 0

LCx: 0

RCA: 0

Total Agatston Score: 0

[HOSPITAL] percentile: 0

AORTA MEASUREMENTS:

Ascending Aorta: 32 mm

Descending Aorta: 25 mm

OTHER FINDINGS:

The heart size is within normal limits. No pericardial fluid is
identified. Visualized segments of the thoracic aorta and central
pulmonary arteries are normal in caliber. Visualized mediastinum and
hilar regions demonstrate no lymphadenopathy or masses. There is a
small hiatal hernia. Bibasilar pulmonary scarring. Visualized lungs
show no evidence of pulmonary edema, consolidation, pneumothorax,
nodule or pleural fluid. Visualized upper abdomen and bony
structures are unremarkable.
IMPRESSION: 1. Coronary calcium score of 0.
2. Small hiatal hernia.

## 2022-12-12 ENCOUNTER — Other Ambulatory Visit: Payer: Self-pay | Admitting: Obstetrics and Gynecology

## 2022-12-12 DIAGNOSIS — M810 Age-related osteoporosis without current pathological fracture: Secondary | ICD-10-CM

## 2022-12-12 NOTE — Telephone Encounter (Signed)
Dr. Salli Quarry patient  AEX 06/28/2022

## 2023-01-18 LAB — HM MAMMOGRAPHY

## 2023-02-22 ENCOUNTER — Other Ambulatory Visit: Payer: Self-pay

## 2023-02-22 DIAGNOSIS — M810 Age-related osteoporosis without current pathological fracture: Secondary | ICD-10-CM

## 2023-02-22 MED ORDER — ALENDRONATE SODIUM 70 MG PO TABS: ORAL_TABLET | ORAL | 1 refills | Status: DC

## 2023-02-22 NOTE — Telephone Encounter (Signed)
Pt LVM in triage line stating she will be out of medication the week after Dr. Oscar La is scheduled to retire.   Last AEX 06/28/2022--78yr med f/u visit recall placed for 06/2023.  Last DEXA 11/24/2021- osteopenia (T-score -1.8)  Rx pend.

## 2023-02-23 NOTE — Telephone Encounter (Signed)
Pt notified and voiced understanding 

## 2023-03-05 ENCOUNTER — Encounter (HOSPITAL_BASED_OUTPATIENT_CLINIC_OR_DEPARTMENT_OTHER): Payer: Self-pay

## 2023-03-05 ENCOUNTER — Emergency Department (HOSPITAL_BASED_OUTPATIENT_CLINIC_OR_DEPARTMENT_OTHER)
Admission: EM | Admit: 2023-03-05 | Discharge: 2023-03-05 | Disposition: A | Discharge status: Home / Self Care | Payer: Medicare Other | Attending: Emergency Medicine | Admitting: Emergency Medicine

## 2023-03-05 ENCOUNTER — Emergency Department (HOSPITAL_BASED_OUTPATIENT_CLINIC_OR_DEPARTMENT_OTHER): Payer: Medicare Other

## 2023-03-05 ENCOUNTER — Other Ambulatory Visit (HOSPITAL_BASED_OUTPATIENT_CLINIC_OR_DEPARTMENT_OTHER): Payer: Self-pay

## 2023-03-05 ENCOUNTER — Other Ambulatory Visit: Payer: Self-pay

## 2023-03-05 DIAGNOSIS — N390 Urinary tract infection, site not specified: Secondary | ICD-10-CM | POA: Diagnosis not present

## 2023-03-05 DIAGNOSIS — Z79899 Other long term (current) drug therapy: Secondary | ICD-10-CM | POA: Diagnosis not present

## 2023-03-05 DIAGNOSIS — R42 Dizziness and giddiness: Secondary | ICD-10-CM | POA: Insufficient documentation

## 2023-03-05 DIAGNOSIS — I10 Essential (primary) hypertension: Secondary | ICD-10-CM | POA: Insufficient documentation

## 2023-03-05 LAB — URINALYSIS, W/ REFLEX TO CULTURE (INFECTION SUSPECTED)
Bacteria, UA: NONE SEEN
Bilirubin Urine: NEGATIVE
Glucose, UA: NEGATIVE mg/dL
Ketones, ur: NEGATIVE mg/dL
Nitrite: NEGATIVE
Specific Gravity, Urine: 1.017 (ref 1.005–1.030)
Trans Epithel, UA: 1
WBC, UA: >50 WBC/hpf (ref 0–5)
pH: 7 (ref 5.0–8.0)

## 2023-03-05 LAB — CBC
HCT: 40 % (ref 36.0–46.0)
Hemoglobin: 14.1 g/dL (ref 12.0–15.0)
MCH: 29.5 pg (ref 26.0–34.0)
MCHC: 35.3 g/dL (ref 30.0–36.0)
MCV: 83.7 fL (ref 80.0–100.0)
Platelets: 186 10*3/uL (ref 150–400)
RBC: 4.78 MIL/uL (ref 3.87–5.11)
RDW: 12.1 % (ref 11.5–15.5)
WBC: 6.4 10*3/uL (ref 4.0–10.5)
nRBC: 0 % (ref 0.0–0.2)

## 2023-03-05 LAB — BASIC METABOLIC PANEL
Anion gap: 10 (ref 5–15)
BUN: 12 mg/dL (ref 8–23)
CO2: 25 mmol/L (ref 22–32)
Calcium: 9.8 mg/dL (ref 8.9–10.3)
Chloride: 95 mmol/L — ABNORMAL LOW (ref 98–111)
Creatinine, Ser: 0.76 mg/dL (ref 0.44–1.00)
GFR, Estimated: >60 mL/min (ref >60)
Glucose, Bld: 132 mg/dL — ABNORMAL HIGH (ref 70–99)
Potassium: 3.3 mmol/L — ABNORMAL LOW (ref 3.5–5.1)
Sodium: 130 mmol/L — ABNORMAL LOW (ref 135–145)

## 2023-03-05 LAB — TROPONIN I (HIGH SENSITIVITY)
Troponin I (High Sensitivity): 2 ng/L (ref <18)
Troponin I (High Sensitivity): 2 ng/L (ref <18)

## 2023-03-05 MED ORDER — CEPHALEXIN 500 MG PO CAPS: 500.0000 mg | ORAL_CAPSULE | Freq: Three times a day (TID) | ORAL | 0 refills | Status: AC

## 2023-03-05 MED ORDER — POTASSIUM CHLORIDE CRYS ER 20 MEQ PO TBCR
40.0000 meq | EXTENDED_RELEASE_TABLET | Freq: Once | ORAL | Status: AC
  Administered 2023-03-05: 40 meq via ORAL
  Filled 2023-03-05: qty 2

## 2023-03-05 MED ORDER — CEPHALEXIN 500 MG PO CAPS
500.0000 mg | ORAL_CAPSULE | Freq: Three times a day (TID) | ORAL | 0 refills | Status: DC
  Filled 2023-03-05: qty 21, 7d supply, fill #0

## 2023-03-05 NOTE — ED Triage Notes (Signed)
Patient here POV from Home.  Endorses Faint Feeling and Nausea that began 0.5 Hours ago. Headache as well that has since subsided. Some SOB that has improved. No numbness or Tingling. No Other Discernable Pain.   NAD Noted during Triage. A&Ox4. GCS 15. BIB Wheelchair.

## 2023-03-05 NOTE — ED Notes (Signed)
Reviewed AVS/discharge instruction with patient. Time allotted for and all questions answered. Patient is agreeable for d/c and escorted to ed exit by staff.  

## 2023-03-05 NOTE — ED Provider Notes (Signed)
[place] EMERGENCY DEPARTMENT AT [facility] Provider Note   CSN: [id number] Arrival date & time: 03/05/23  1249     History {Add pertinent medical, surgical, social history, OB history to HPI:1} Chief Complaint  Patient presents with   Dizziness    Megan Huang is a 77 y.o. female.  HPI     77yo female with history of hypertension, hypercholesterolemia, von willebrand disease, hyperthyroidism, presents with concern for episode of lightheadedness.     Sitting at a restaurant, suddenly felt very lightheaded, like couldn't catch breath "am I going to die" feeling for the first time. Lasted for a few minutes, then began to think she would be better than the sensation came back again, then felt headache then it went away. No pain just a feeling of near-syncope and weakness. Lasted less than an hour.  Took benadryl, rescue inhaler, tylenol.  Then went to lay down. Now feel ok.  No chest chest pain.  Did feel like not getting any air in. Not like asthma thing, just like a feeling of no air. Was taking a breath.    Denies numbness, weakness, difficulty talking or walking, visual changes or facial droop.   Had general weakness.  Had vertigo in past but didn't feel like that, not vertigo or room spinning nor slanting.  Felt this way when pregnant with hyperemesis prior to hospitalized.   Didn't pay attention to if having palpitations.  Felt like system shutting down.  Went for a mile walk this AM. No hx of dvt/pe, no leg pain or swelling, no recent travel or surgeries A week ago was in car and did get out  No diarrhea, nausea, vomiting, diarrhea, black or bloody stools, fever, cough, no urinary symptoms, back pain.  Abd pain had gallbladder out a year ago and acting up  Past Medical History:  Diagnosis Date   Arthritis    Asthma    DUB (dysfunctional uterine bleeding)    Elevated cholesterol    Family history of adverse reaction to anesthesia    mother woke up during  anesthesia and hallucinations afterwards   Family history of cancer    Family history of colon cancer    Family history of ovarian cancer    Family history of prostate cancer    Family history of uterine cancer    Fibroid    GERD (gastroesophageal reflux disease)    Headache    Hypertension    Osteopenia    Osteopenia    PONV (postoperative nausea and vomiting)    Thyroid disease    Hyperthyroid   Von Willebrand disease (HCC)    hx of per pt    Past Surgical History:  Procedure Laterality Date   cataract surgery      CHOLECYSTECTOMY N/A 04/25/2021   Procedure: LAPAROSCOPIC CHOLECYSTECTOMY WITH INTRAOPERATIVE CHOLANGIOGRAM;  Surgeon: Darnell Level, MD;  Location: [facility] ORS;  Service: General;  Laterality: N/A;   MOUTH SURGERY     OOPHORECTOMY  09/04/1996   RSO   TUBAL LIGATION     VAGINAL HYSTERECTOMY  09/04/1996   one ovary intact    Home Medications Prior to Admission medications   Medication Sig Start Date End Date Taking? Authorizing Provider  alendronate (FOSAMAX) 70 MG tablet TAKE 1 TAB EVERY 7 DAYS, FIRST THING IN AM WITH 6 OZ. WATER. STAY UPRIGHT &EAT NOTHING FOR 1 HOUR. 02/22/23   Romualdo Bolk, MD  atorvastatin (LIPITOR) 10 MG tablet Take 5 mg by mouth daily.  [provider]  Calcium Carbonate-Vitamin D (CALCIUM + D PO) Take 1 tablet by mouth in the morning and at bedtime.    [provider]  cetirizine (ZYRTEC) 10 MG tablet Take 10 mg by mouth daily.    [provider]  famotidine (PEPCID) 20 MG tablet Take 20 mg by mouth daily.    [provider]  fluticasone (FLONASE) 50 MCG/ACT nasal spray Place 1 spray into both nostrils daily as needed for allergies.  04/09/15   [provider]  Fluticasone Furoate (ARNUITY ELLIPTA) 100 MCG/ACT AEPB Inhale 1 puff into the lungs daily.    [provider]  hydrochlorothiazide (HYDRODIURIL) 25 MG tablet Take 25 mg by mouth daily. 04/25/15   [provider]   methimazole (TAPAZOLE) 10 MG tablet Take 5 mg by mouth daily. 03/10/15   [provider]  metoprolol tartrate (LOPRESSOR) 25 MG tablet Take 12.5 mg by mouth 2 (two) times daily as needed (Heart rhythem). Patient not taking: Reported on 06/28/2022    [provider]  Multiple Vitamin (MULTIVITAMIN) tablet Take 1 tablet by mouth daily.    [provider]  nitroGLYCERIN (NITROSTAT) 0.4 MG SL tablet Place 0.4 mg under the tongue every 5 (five) minutes as needed for chest pain. Patient not taking: Reported on 06/28/2022    [provider]  Polyethyl Glycol-Propyl Glycol (SYSTANE OP) Place 1 drop into both eyes in the morning and at bedtime. Patient not taking: Reported on 06/28/2022    [provider]  PROAIR HFA 108 [phone] Base) MCG/ACT inhaler Inhale 2 puffs into the lungs every 6 (six) hours as needed for wheezing or shortness of breath. 07/06/17   [provider]      Allergies    Black pepper [piper], Fish allergy, Shellfish allergy, Wasp venom, Aspirin, Ibuprofen, Moxifloxacin, Penicillins, and Wool alcohol [lanolin]    Review of Systems   Review of Systems  Physical Exam Updated Vital Signs BP (!) 147/88   Pulse 69   Temp (!) 97.5 F (36.4 C) (Oral)   Resp 16   LMP 09/04/1996   SpO2 97%  Physical Exam Vitals and nursing note reviewed.  Constitutional:      General: She is not in acute distress.    Appearance: She is well-developed. She is not diaphoretic.  HENT:     Head: Normocephalic and atraumatic.  Eyes:     Conjunctiva/sclera: Conjunctivae normal.  Cardiovascular:     Rate and Rhythm: Normal rate and regular rhythm.     Heart sounds: Normal heart sounds. No murmur heard.    No friction rub. No gallop.  Pulmonary:     Effort: Pulmonary effort is normal. No respiratory distress.     Breath sounds: Normal breath sounds. No wheezing or rales.  Abdominal:     General: There is no distension.     Palpations: Abdomen is  soft.     Tenderness: There is no abdominal tenderness. There is no guarding.  Musculoskeletal:        General: No tenderness.     Cervical back: Normal range of motion.  Skin:    General: Skin is warm and dry.     Findings: No erythema or rash.  Neurological:     Mental Status: She is alert and oriented to person, place, and time.     ED Results / Procedures / Treatments   Labs (all labs ordered are listed, but only abnormal results are displayed) Labs Reviewed  BASIC METABOLIC PANEL -  Abnormal; Notable for the following components:      Result Value   Sodium 130 (*)    Potassium 3.3 (*)    Chloride 95 (*)    Glucose, Bld 132 (*)    All other components within normal limits  CBC  TROPONIN I (HIGH SENSITIVITY)  TROPONIN I (HIGH SENSITIVITY)  TROPONIN I (HIGH SENSITIVITY)    EKG EKG Interpretation Date/Time:  Monday March 05 2023 12:57:37 EDT Ventricular Rate:  71 PR Interval:  138 QRS Duration:  78 QT Interval:  398 QTC Calculation: 432 R Axis:   28  Text Interpretation: Normal sinus rhythm Low voltage QRS Cannot rule out Anterior infarct (cited on or before 05-Mar-2023) Abnormal ECG When compared with ECG of 12-Apr-2021 13:38, Questionable change in initial forces of Anterior leads Confirmed by Alvester Chou [phone]) on 03/05/2023 2:48:44 PM  Radiology DG Chest Portable 1 View  Result Date: 03/05/2023 CLINICAL DATA:  SOB EXAM: PORTABLE CHEST 1 VIEW COMPARISON:  Chest x-ray December 19, 2009. FINDINGS: The heart size and mediastinal contours are within normal limits. Both lungs are clear. No visible pleural effusions or pneumothorax. No acute osseous abnormality. IMPRESSION: No active disease. Electronically Signed   By: Feliberto Harts M.D.   On: 03/05/2023 14:04    Procedures Procedures  {Document cardiac monitor, telemetry assessment procedure when appropriate:1}  Medications Ordered in ED Medications - No data to display  ED Course/ Medical Decision Making/ A&P    {   Click here for ABCD2, HEART and other calculatorsREFRESH Note before signing :1}                          77yo female with history of hypertension, hypercholesterolemia, von willebrand disease, hyperthyroidism, presents with concern for episode of lightheadedness.    {Document critical care time when appropriate:1} {Document review of labs and clinical decision tools ie heart score, Chads2Vasc2 etc:1}  {Document your independent review of radiology images, and any outside records:1} {Document your discussion with family members, caretakers, and with consultants:1} {Document social determinants of health affecting pt's care:1} {Document your decision making why or why not admission, treatments were needed:1} Final Clinical Impression(s) / ED Diagnoses Final diagnoses:  None    Rx / DC Orders ED Discharge Orders     None

## 2023-03-05 NOTE — ED Notes (Signed)
Pt from home with dizziness and headache that was sudden; as well as a brief period of sob that did not feel like her normal asthma. Pt states she felt like this once before when she was pregnant many years ago and had electrolyte imbalances. Pt alert & oriented, symptoms mostly resolved at this time; although she still feels a bit "woozy."

## 2023-03-15 ENCOUNTER — Encounter (HOSPITAL_COMMUNITY): Payer: Self-pay | Admitting: Internal Medicine

## 2023-03-15 DIAGNOSIS — R42 Dizziness and giddiness: Secondary | ICD-10-CM

## 2023-03-16 ENCOUNTER — Other Ambulatory Visit (HOSPITAL_COMMUNITY): Payer: Self-pay | Admitting: Internal Medicine

## 2023-03-16 DIAGNOSIS — I10 Essential (primary) hypertension: Secondary | ICD-10-CM

## 2023-03-16 DIAGNOSIS — R42 Dizziness and giddiness: Secondary | ICD-10-CM

## 2023-03-16 DIAGNOSIS — E871 Hypo-osmolality and hyponatremia: Secondary | ICD-10-CM

## 2023-03-28 ENCOUNTER — Ambulatory Visit (HOSPITAL_COMMUNITY)
Admission: RE | Admit: 2023-03-28 | Discharge: 2023-03-28 | Disposition: A | Discharge status: Home / Self Care | Payer: Medicare Other | Source: Ambulatory Visit | Attending: Internal Medicine | Admitting: Internal Medicine

## 2023-03-28 DIAGNOSIS — I10 Essential (primary) hypertension: Secondary | ICD-10-CM | POA: Insufficient documentation

## 2023-03-28 DIAGNOSIS — I358 Other nonrheumatic aortic valve disorders: Secondary | ICD-10-CM | POA: Diagnosis not present

## 2023-03-28 DIAGNOSIS — E871 Hypo-osmolality and hyponatremia: Secondary | ICD-10-CM | POA: Insufficient documentation

## 2023-03-28 DIAGNOSIS — R600 Localized edema: Secondary | ICD-10-CM | POA: Diagnosis not present

## 2023-03-28 DIAGNOSIS — R42 Dizziness and giddiness: Secondary | ICD-10-CM | POA: Insufficient documentation

## 2023-03-28 LAB — ECHOCARDIOGRAM COMPLETE
Area-P 1/2: 3.85 cm2
Calc EF: 57.6 %
S' Lateral: 2.3 cm
Single Plane A2C EF: 58.8 %
Single Plane A4C EF: 58.9 %

## 2023-03-28 NOTE — Progress Notes (Signed)
Echocardiogram 2D Echocardiogram has been performed.  BRUNILDA EBLE 03/28/2023, 10:35 AM

## 2023-05-16 ENCOUNTER — Ambulatory Visit (HOSPITAL_BASED_OUTPATIENT_CLINIC_OR_DEPARTMENT_OTHER): Payer: Medicare Other | Admitting: Cardiology

## 2023-05-16 ENCOUNTER — Encounter (HOSPITAL_BASED_OUTPATIENT_CLINIC_OR_DEPARTMENT_OTHER): Payer: Self-pay | Admitting: Cardiology

## 2023-05-16 VITALS — BP 123/71 | HR 79 | Ht 63.0 in | Wt 166.4 lb

## 2023-05-16 DIAGNOSIS — I10 Essential (primary) hypertension: Secondary | ICD-10-CM

## 2023-05-16 DIAGNOSIS — Z7189 Other specified counseling: Secondary | ICD-10-CM | POA: Diagnosis not present

## 2023-05-16 DIAGNOSIS — E785 Hyperlipidemia, unspecified: Secondary | ICD-10-CM

## 2023-05-16 DIAGNOSIS — R42 Dizziness and giddiness: Secondary | ICD-10-CM

## 2023-05-16 NOTE — Patient Instructions (Signed)
Medication Instructions:  ?Your physician recommends that you continue on your current medications as directed. Please refer to the Current Medication list given to you today.  ? ?Labwork: ?NONE ? ?Testing/Procedures: ?NONE ? ?Follow-Up: ?AS NEEDED  ? ?  ?

## 2023-05-16 NOTE — Progress Notes (Signed)
Cardiology Office Note:  .    Date:  05/16/2023  ID:  [name], DOB [birth year], MRN [id number] PCP: Thana Ates, MD  [facility] Providers Cardiologist:  Jodelle Red, MD     History of Present Illness: .    Megan Huang is a 77 y.o. female with a hx of hypertension, hyperlipidemia, hyperthyroidism, von willebrand disease, GERD, arthritis, asthma, here for the evaluation of lightheadedness.  On 03/05/2023 she presented to the ER with complaints of acute lightheadedness/presyncope and felt like she couldn't catch her breath in the setting of sitting at a restaurant. No known allergy exposures. Dissimilar to prior vertigo. EKG did not show significant ST changes or arrhythmia. Lab work notable for mild hyponatremia, mild hypokalemia, no anemia or leukocytosis. Troponin negative x2. UA was consistent with UTI and she was prescribed antibiotics. Discharged with recommendation for outpatient cardiology follow-up.  Previously seen by Dr. Excell Seltzer and Cline Crock, PA-C in 09/2015 for chest pain. Exercise myoview 09/2015 was normal and low risk. Noted to have a family history of heart disease: her mother had MI in her early 36's.  Cardiovascular risk factors: Prior clinical ASCVD: None Comorbid conditions:  Hypertension - In the office her blood pressure is 147/77 which she states is high for her. She believes white coat syndrome is contributory. She presents a blood pressure log which is personally reviewed. Currently taking HCTZ 12.5 mg daily. Metabolic syndrome/Obesity:  Current weight 166 lbs. Chronic inflammatory conditions: None Tobacco use history:  Former smoker. Family history: Her mother had MI in her early 28's. Prior pertinent testing and/or incidental findings: She had an echocardiogram 03/28/2023 revealing LVEF 60-65%, grade 2 diastolic dysfunction, trivial mitral and aortic regurgitation, aortic valve sclerosis/calcification without stenosis. Holter monitor 08/2017  showed rare PACs and short nonsustained atrial tachycardia episodes. Exercise myoview 09/2015 was normal and low risk. Exercise level: Completes 30 minutes of exercise daily.  Presyncope/Lightheadedness: Initial episode: Today, we reviewed the episode prompting her ER visit. During the event, she felt like she was breathing but couldn't breathe, like there was no air. She had a sense that she could be dying, like everything was stopping and she was about to fall over. She did not pass out. Lasted for a few minutes. Was very wobbly. Went home 10 minutes later, then was feeling unwell again and couldn't breathe. Her symptoms were not affected by using benadryl or her rescue inhaler, although they did improve by the time she arrived at the ER.  Frequency: At this time, she has been feeling much better. In the interim she has experienced a few lesser episodes, typically with acute onset and at random times no matter what she is doing.  Aggravating/alleviating factors: Of note, one such episode seems to have been triggered upon reaching a higher elevation when she goes traveling to the mountains.  ECG:  EKG 03/05/23 showed NSR, low voltage QRS, cannot rule out anterior infarct.  Additionally, her abdominal and lower back pain has resolved at this time. She was unaware that she had a UTI when she presented to the ER. No further cramping in legs and feet with potassium supplementation.  She complains of a chronic cough.  She denies any palpitations, chest pain, peripheral edema, headaches, orthopnea, or PND.  ROS:  Please see the history of present illness. ROS otherwise negative except as noted.  (+) Presyncope with lightheadedness and shortness of breath (+) Chronic cough  Studies Reviewed: .  Echo  03/28/2023: Sonographer Comments: Global longitudinal strain was attempted.   IMPRESSIONS   1. Left ventricular ejection fraction, by estimation, is 60 to 65%. The  left ventricle has normal  function. The left ventricle has no regional  wall motion abnormalities. Left ventricular diastolic parameters are  consistent with Grade II diastolic  dysfunction (pseudonormalization).   2. Right ventricular systolic function is normal. The right ventricular  size is normal. There is normal pulmonary artery systolic pressure. The  estimated right ventricular systolic pressure is 31.5 mmHg.   3. The mitral valve is normal in structure. Trivial mitral valve  regurgitation. No evidence of mitral stenosis.   4. The aortic valve is tricuspid. There is mild calcification of the  aortic valve. There is mild thickening of the aortic valve. Aortic valve  regurgitation is trivial. Aortic valve sclerosis/calcification is present,  without any evidence of aortic  stenosis.   5. The inferior vena cava is normal in size with greater than 50%  respiratory variability, suggesting right atrial pressure of 3 mmHg.   Comparison(s): No prior Echocardiogram.   Physical Exam:    VS:  BP 123/71 Comment: home  Pulse 79   Ht 5\' 3"  (1.6 m)   Wt 166 lb 6.4 oz (75.5 kg)   LMP 09/04/1996   SpO2 97%   BMI 29.48 kg/m     Orthostatics: Lying 157/74, HR 68 Sitting 152/74, HR 88 Standing 166/75, HR 75 Standing 3 min  136/80, HR 76  Wt Readings from Last 3 Encounters:  05/16/23 166 lb 6.4 oz (75.5 kg)  06/28/22 163 lb (73.9 kg)  12/21/21 164 lb (74.4 kg)    GEN: Well nourished, well developed in no acute distress HEENT: Normal, moist mucous membranes NECK: No JVD CARDIAC: regular rhythm, normal S1 and S2, no rubs or gallops. No murmur. VASCULAR: Radial and DP pulses 2+ bilaterally. No carotid bruits RESPIRATORY:  Clear to auscultation without rales, wheezing or rhonchi  ABDOMEN: Soft, non-tender, non-distended MUSCULOSKELETAL:  Ambulates independently SKIN: Warm and dry, no edema NEUROLOGIC:  Alert and oriented x 3. No focal neuro deficits noted. PSYCHIATRIC:  Normal affect   ASSESSMENT AND PLAN:  .    Lightheadedness -more of a sensation of being unable to breath. Blood pressure actually rises upon initially standing, not consistent with orthostasis. Normalizes after standing for a time. -echo unremarkable, not consistent with structural heart disease -reviewed red flags that need immediate medical attention  Hypertension Component of white coat syndrome -blood pressures at home much better controlled (120s/70s). Has history of elevations in the office consistent with white coat syndrome -continue hydrochlorothiazide, telmisartan  Hyperlipidemia -continue atorvastatin  CV risk counseling and prevention -recommend heart healthy/Mediterranean diet, with whole grains, fruits, vegetable, fish, lean meats, nuts, and olive oil. Limit salt. -recommend moderate walking, 3-5 times/week for 30-50 minutes each session. Aim for at least 150 minutes.week. Goal should be pace of 3 miles/hours, or walking 1.5 miles in 30 minutes -recommend avoidance of tobacco products. Avoid excess alcohol.  Dispo: Follow-up as needed.  I,Mathew Stumpf,acting as a Neurosurgeon for [employer], MD.,have documented all relevant documentation on the behalf of Jodelle Red, MD,as directed by  Jodelle Red, MD while in the presence of Jodelle Red, MD.  I, Jodelle Red, MD, have reviewed all documentation for this visit. The documentation on 07/02/23 for the exam, diagnosis, procedures, and orders are all accurate and complete.   Signed, Jodelle Red, MD

## 2023-07-02 ENCOUNTER — Encounter (HOSPITAL_BASED_OUTPATIENT_CLINIC_OR_DEPARTMENT_OTHER): Payer: Self-pay | Admitting: Cardiology

## 2023-08-07 ENCOUNTER — Encounter: Payer: Self-pay | Admitting: Obstetrics and Gynecology

## 2023-08-07 ENCOUNTER — Ambulatory Visit (INDEPENDENT_AMBULATORY_CARE_PROVIDER_SITE_OTHER): Payer: Medicare Other | Admitting: Obstetrics and Gynecology

## 2023-08-07 VITALS — BP 116/76 | HR 77 | Ht 63.5 in | Wt 163.0 lb

## 2023-08-07 DIAGNOSIS — M8589 Other specified disorders of bone density and structure, multiple sites: Secondary | ICD-10-CM | POA: Diagnosis not present

## 2023-08-07 DIAGNOSIS — Z8041 Family history of malignant neoplasm of ovary: Secondary | ICD-10-CM | POA: Diagnosis not present

## 2023-08-07 DIAGNOSIS — Z5181 Encounter for therapeutic drug level monitoring: Secondary | ICD-10-CM

## 2023-08-07 MED ORDER — ALENDRONATE SODIUM 70 MG PO TABS: ORAL_TABLET | ORAL | 3 refills | Status: DC

## 2023-08-07 NOTE — Progress Notes (Unsigned)
GYNECOLOGY  VISIT   HPI: 77 y.o.   Married  Caucasian female   [phone] with Patient's last menstrual period was 09/04/1996.   here for: 1 yr med check.  Taking Fosamax for osteopenia since 2023.  No personal hx of fracture.  Strong family history of osteoporosis.   Last BMD 11/22/21 at [place].  T score of right hip: -1.7, left hip: -1.8, spine: -1.7 FRAX:  18%/4%.    Has hiatal hernia.   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  Does ok swallowing pills.    Calcium 9.8 and Cr 0.76 on 03/05/23.  Recent fall and no fracture.   Mother had ovarian and colon cancer and was positive for BRCA2.  Patient had negative genetic testing.   Patient has one remaining ovary.  She has periodic CA125.   She had a hysterectomy and RSO oophorectomy for fibroids.  States she had atypical cells noted.  GYNECOLOGIC HISTORY: Patient's last menstrual period was 09/04/1996. Contraception:  hyst Menopausal hormone therapy:  n/a Last 2 paps:  12/07/10 History of abnormal Pap or positive HPV:  no Mammogram: 01/2023 per pt possibly? 01/06/22 Breast Density Cat B, BI-RADS CAT 2 benign        OB History     Gravida  6   Para  2   Term  2   Preterm      AB  4   Living  2      SAB      IAB      Ectopic      Multiple      Live Births                 Patient Active Problem List   Diagnosis Date Noted   Genetic testing 09/15/2022   Family history of colon cancer 09/06/2022   Family history of ovarian cancer 09/06/2022   Family history of uterine cancer 09/06/2022   Family history of prostate cancer 09/06/2022   Cholecystitis with cholelithiasis 04/25/2021   Cholelithiasis with chronic cholecystitis 04/17/2021   VWD (von Willebrand's disease) (HCC) 09/23/2015   Bleeding diathesis (HCC) 09/23/2015   Family history of cancer    Elevated cholesterol    Fibroid    Osteopenia    DUB (dysfunctional uterine bleeding)    Thyroid disease    GERD (gastroesophageal reflux  disease)    Hypertension     Past Medical History:  Diagnosis Date   Arthritis    Asthma    DUB (dysfunctional uterine bleeding)    Elevated cholesterol    Family history of adverse reaction to anesthesia    mother woke up during anesthesia and hallucinations afterwards   Family history of cancer    Family history of colon cancer    Family history of ovarian cancer    Family history of prostate cancer    Family history of uterine cancer    Fibroid    GERD (gastroesophageal reflux disease)    Headache    Hypertension    Osteopenia    Osteopenia    PONV (postoperative nausea and vomiting)    Thyroid disease    Hyperthyroid   Von Willebrand disease (HCC)    hx of per pt    Past Surgical History:  Procedure Laterality Date   cataract surgery      CHOLECYSTECTOMY N/A 04/25/2021   Procedure: LAPAROSCOPIC CHOLECYSTECTOMY WITH INTRAOPERATIVE CHOLANGIOGRAM;  Surgeon: Darnell Level, MD;  Location: [facility];  Service: General;  Laterality:  N/A;   MOUTH SURGERY     OOPHORECTOMY  09/04/1996   RSO   TUBAL LIGATION     VAGINAL HYSTERECTOMY  09/04/1996   one ovary intact    Current Outpatient Medications  Medication Sig Dispense Refill   atorvastatin (LIPITOR) 10 MG tablet Take 5 mg by mouth daily.     Calcium Carbonate-Vitamin D (CALCIUM + D PO) Take 1 tablet by mouth in the morning and at bedtime.     cetirizine (ZYRTEC) 10 MG tablet Take 10 mg by mouth daily.     famotidine (PEPCID) 20 MG tablet Take 20 mg by mouth daily.     fluticasone (FLONASE) 50 MCG/ACT nasal spray Place 1 spray into both nostrils daily as needed for allergies.   11   Fluticasone Furoate (ARNUITY ELLIPTA) 100 MCG/ACT AEPB Inhale 1 puff into the lungs daily.     hydrochlorothiazide (HYDRODIURIL) 25 MG tablet Take 12.5 mg by mouth daily.  11   methimazole (TAPAZOLE) 10 MG tablet Take 5 mg by mouth daily.  3   metoprolol tartrate (LOPRESSOR) 25 MG tablet Take 12.5 mg by mouth 2 (two) times daily as needed (Heart  rhythem).     Multiple Vitamin (MULTIVITAMIN) tablet Take 1 tablet by mouth daily.     nitroGLYCERIN (NITROSTAT) 0.4 MG SL tablet Place 0.4 mg under the tongue every 5 (five) minutes as needed for chest pain.     Polyethyl Glycol-Propyl Glycol (SYSTANE OP) Place 1 drop into both eyes in the morning and at bedtime.     PROAIR HFA 108 (90 Base) MCG/ACT inhaler Inhale 2 puffs into the lungs every 6 (six) hours as needed for wheezing or shortness of breath.  1   telmisartan (MICARDIS) 20 MG tablet Take 20 mg by mouth daily.     alendronate (FOSAMAX) 70 MG tablet TAKE 1 TAB EVERY 7 DAYS, FIRST THING IN AM WITH 6 OZ. WATER. STAY UPRIGHT &EAT NOTHING FOR 1 HOUR. 12 tablet 3   No current facility-administered medications for this visit.     ALLERGIES: Black pepper [piper], Fish allergy, Shellfish allergy, Wasp venom, Aspirin, Ibuprofen, Moxifloxacin, Penicillins, and Wool alcohol [lanolin]  Family History  Problem Relation Age of Onset   Heart disease Mother    Hypertension Mother    Ovarian cancer Mother 50   Colon cancer Mother 15   Skin cancer Mother    Osteoporosis Mother    Uterine cancer Mother 11   Leukemia Father    Prostate cancer Father        dx 59s   Skin cancer Father    Skin cancer Sister    Prostate cancer Brother        dx 60s   Heart disease Brother    Melanoma Brother        dx 38s   Skin cancer Brother    Prostate cancer Brother        dx 66s   Skin cancer Brother    Breast cancer Maternal Aunt 53       deceased 68   Breast cancer Maternal Aunt        dx 71s   Dementia Maternal Grandmother    Lung cancer Maternal Grandfather    Uterine cancer Paternal Grandmother    Prostate cancer Paternal Grandfather    Other Daughter        Mast Cell Disorder/Condition   Hypoparathyroidism Daughter    Thyroid disease Daughter    Other Daughter  Mast Cell Disorder/Condition   Thyroid disease Daughter    Ovarian cancer Other    Colon cancer Other    Colon cancer  Nephew 42    Social History   Socioeconomic History   Marital status: Married    Spouse name: Not on file   Number of children: Not on file   Years of education: Not on file   Highest education level: Not on file  Occupational History   Not on file  Tobacco Use   Smoking status: Former    Passive exposure: Never   Smokeless tobacco: Never  Vaping Use   Vaping status: Never Used  Substance and Sexual Activity   Alcohol use: Yes    Alcohol/week: 5.0 standard drinks of alcohol    Types: 5 Glasses of wine per week    Comment: social   Drug use: No   Sexual activity: Yes    Partners: Male    Birth control/protection: Surgical, Post-menopausal  Other Topics Concern   Not on file  Social History Narrative   Not on file   Social Determinants of Health   Financial Resource Strain: Not on file  Food Insecurity: Not on file  Transportation Needs: Not on file  Physical Activity: Not on file  Stress: Not on file  Social Connections: Not on file  Intimate Partner Violence: Not on file    Review of Systems  All other systems reviewed and are negative.   PHYSICAL EXAMINATION:   BP 116/76 (BP Location: Right Arm, Patient Position: Sitting, Cuff Size: Normal)   Pulse 77   Ht 5' 3.5" (1.613 m)   Wt 163 lb (73.9 kg)   LMP 09/04/1996   SpO2 96%   BMI 28.42 kg/m     General appearance: alert, cooperative and appears stated age   ASSESSMENT:  Osteopenia, increased risk of fracture.  Encounter for medication monitoring.  Family history of ovarian cancer, colon cancer, and other cancers.  Personal negative genetic testing.    PLAN:  Continue Fosamax weekly.  Calcium 1200 mg daily and vit D at least 600 - 800 international units daily. Fall risk reduction reviewed.  Next bone density in 2025.  We reviewed that her risk of ovarian cancer is likely at the general population risk due to her negative genetic testing.  Pelvic US and CA125 testing can be performed if desired.   This was not ordered today.  FU for routine breast and pelvic exam and prn.   29 min  total time was spent for this patient encounter, including preparation, face-to-face counseling with the patient, coordination of care, and documentation of the encounter.

## 2023-09-11 ENCOUNTER — Encounter (HOSPITAL_BASED_OUTPATIENT_CLINIC_OR_DEPARTMENT_OTHER): Payer: Self-pay | Admitting: Emergency Medicine

## 2023-09-11 ENCOUNTER — Emergency Department (HOSPITAL_BASED_OUTPATIENT_CLINIC_OR_DEPARTMENT_OTHER)
Admission: EM | Admit: 2023-09-11 | Discharge: 2023-09-11 | Disposition: A | Discharge status: Home / Self Care | Payer: Medicare Other

## 2023-09-11 ENCOUNTER — Other Ambulatory Visit: Payer: Self-pay

## 2023-09-11 DIAGNOSIS — R0602 Shortness of breath: Secondary | ICD-10-CM | POA: Diagnosis present

## 2023-09-11 DIAGNOSIS — T781XXA Other adverse food reactions, not elsewhere classified, initial encounter: Secondary | ICD-10-CM | POA: Insufficient documentation

## 2023-09-11 DIAGNOSIS — T7840XA Allergy, unspecified, initial encounter: Secondary | ICD-10-CM

## 2023-09-11 MED ORDER — METHYLPREDNISOLONE SODIUM SUCC 125 MG IJ SOLR
125.0000 mg | Freq: Once | INTRAMUSCULAR | Status: AC
  Administered 2023-09-11: 125 mg via INTRAVENOUS
  Filled 2023-09-11: qty 2

## 2023-09-11 MED ORDER — FAMOTIDINE IN NACL 20-0.9 MG/50ML-% IV SOLN
20.0000 mg | Freq: Once | INTRAVENOUS | Status: AC
  Administered 2023-09-11: 20 mg via INTRAVENOUS
  Filled 2023-09-11: qty 50

## 2023-09-11 MED ORDER — IPRATROPIUM-ALBUTEROL 0.5-2.5 (3) MG/3ML IN SOLN
3.0000 mL | Freq: Once | RESPIRATORY_TRACT | Status: AC
  Administered 2023-09-11: 3 mL via RESPIRATORY_TRACT
  Filled 2023-09-11: qty 3

## 2023-09-11 NOTE — ED Triage Notes (Signed)
 Pt c/o allergic reaction to something while out to dinner. Pt c/o flushed face and felt like she was going to pass out.  50mg  benadryl Used her inhaler

## 2023-09-11 NOTE — Discharge Instructions (Signed)
Follow up with your doctor as needed. REturn to the ED with any new or concerning symptoms.

## 2023-09-11 NOTE — ED Provider Notes (Signed)
 [place] EMERGENCY DEPARTMENT AT [facility] Provider Note   CSN: [id number] Arrival date & time: 09/11/23  1949     History  Chief Complaint  Patient presents with   Allergic Reaction    Megan Huang is a 78 y.o. female.  Patient to ED with sudden onset of symptoms of feeling flushed, SOB, nausea while eating dinner about 15 minutes prior to arrival. She reports she has food allergies and feels her dinner must have contained an allergen that she reacted to. She took Benadryl 50 mg and husband brought her for treatment. She has an EpiPen  at home but did not use it. No vomiting. She reports she is improving over time. No difficulty swallowing. No rash.   The history is provided by the patient and the spouse. No language interpreter was used.  Allergic Reaction      Home Medications Prior to Admission medications   Medication Sig Start Date End Date Taking? Authorizing Provider  alendronate  (FOSAMAX ) 70 MG tablet TAKE 1 TAB EVERY 7 DAYS, FIRST THING IN AM WITH 6 OZ. WATER. STAY UPRIGHT &EAT NOTHING FOR 1 HOUR. 08/07/23   Cathlyn JAYSON Cary, Bobie BRAVO, MD  atorvastatin (LIPITOR) 10 MG tablet Take 5 mg by mouth daily.    [provider]  Calcium Carbonate-Vitamin D  (CALCIUM + D PO) Take 1 tablet by mouth in the morning and at bedtime.    [provider]  cetirizine (ZYRTEC) 10 MG tablet Take 10 mg by mouth daily.    [provider]  famotidine  (PEPCID ) 20 MG tablet Take 20 mg by mouth daily.    [provider]  fluticasone (FLONASE) 50 MCG/ACT nasal spray Place 1 spray into both nostrils daily as needed for allergies.  04/09/15   [provider]  Fluticasone Furoate (ARNUITY ELLIPTA) 100 MCG/ACT AEPB Inhale 1 puff into the lungs daily.    [provider]  hydrochlorothiazide  (HYDRODIURIL ) 25 MG tablet Take 12.5 mg by mouth daily. 04/25/15   [provider]  methimazole  (TAPAZOLE ) 10 MG tablet Take 5 mg by mouth daily.  03/10/15   [provider]  metoprolol  tartrate (LOPRESSOR ) 25 MG tablet Take 12.5 mg by mouth 2 (two) times daily as needed (Heart rhythem).    [provider]  Multiple Vitamin (MULTIVITAMIN) tablet Take 1 tablet by mouth daily.    [provider]  nitroGLYCERIN (NITROSTAT) 0.4 MG SL tablet Place 0.4 mg under the tongue every 5 (five) minutes as needed for chest pain.    [provider]  Polyethyl Glycol-Propyl Glycol (SYSTANE OP) Place 1 drop into both eyes in the morning and at bedtime.    [provider]  PROAIR  HFA 108 (90 Base) MCG/ACT inhaler Inhale 2 puffs into the lungs every 6 (six) hours as needed for wheezing or shortness of breath. 07/06/17   [provider]  telmisartan (MICARDIS) 20 MG tablet Take 20 mg by mouth daily. 05/01/23   [provider]      Allergies    Black pepper [piper], Fish allergy, Shellfish allergy, Wasp venom, Aspirin, Ibuprofen, Moxifloxacin, Penicillins, and Wool alcohol [lanolin]    Review of Systems   Review of Systems  Physical Exam Updated Vital Signs BP 127/73   Pulse 73   Temp 98.5 F (36.9 C)   Resp 20   Ht 5' 3.5 (1.613 m)   Wt 73.9 kg   LMP 09/04/1996   SpO2 100%   BMI 28.41 kg/m  Physical Exam Vitals and  nursing note reviewed.  Constitutional:      Appearance: She is well-developed.  HENT:     Head: Normocephalic.  Cardiovascular:     Rate and Rhythm: Normal rate and regular rhythm.     Heart sounds: No murmur heard. Pulmonary:     Effort: Pulmonary effort is normal.     Breath sounds: Normal breath sounds. No wheezing, rhonchi or rales.  Abdominal:     General: Bowel sounds are normal.     Palpations: Abdomen is soft.     Tenderness: There is no abdominal tenderness. There is no guarding or rebound.  Musculoskeletal:        General: Normal range of motion.     Cervical back: Normal range of motion and neck supple.  Skin:    General: Skin is warm and dry.   Neurological:     General: No focal deficit present.     Mental Status: She is alert and oriented to person, place, and time.     ED Results / Procedures / Treatments   Labs (all labs ordered are listed, but only abnormal results are displayed) Labs Reviewed - No data to display  EKG None  Radiology No results found.  Procedures Procedures    Medications Ordered in ED Medications  methylPREDNISolone  sodium succinate (SOLU-MEDROL ) 125 mg/2 mL injection 125 mg (125 mg Intravenous Given 09/11/23 2012)  famotidine  (PEPCID ) IVPB 20 mg premix (20 mg Intravenous New Bag/Given 09/11/23 2013)  ipratropium-albuterol  (DUONEB) 0.5-2.5 (3) MG/3ML nebulizer solution 3 mL (3 mLs Nebulization Given 09/11/23 2009)    ED Course/ Medical Decision Making/ A&P                                 Medical Decision Making This patient presents to the ED for concern of sudden onset SOB, this involves an extensive number of treatment options, and is a complaint that carries with it a high risk of complications and morbidity.  The differential diagnosis includes flash edema, allergic reaction, URI, PNA, PE,    Co morbidities that complicate the patient evaluation  Previous allergic reaction to food   Additional history obtained:  Additional history and/or information obtained from chart review, notable for spouse at bedside   Lab Tests:  I Ordered, and personally interpreted labs.  The pertinent results include:  N/A    Imaging Studies ordered:  I ordered imaging studies including n/a I independently visualized and interpreted imaging which showed n/a I agree with the radiologist interpretation  Cardiac/EKG - sinus rhythm, rate 71  Medicines ordered and prescription drug management:  I ordered medication including solumedrol, albuterol , pepcid   for allergic reaction Reevaluation of the patient after these medicines showed that the patient resolved I have reviewed the patients home  medicines and have made adjustments as needed    Consultations Obtained:  I requested consultation with the ED attending, Dr. Ula, who has seen and evaluated the patient.     Problem List / ED Course:  Acute onset SOB, nausea after eating dinner tonight Reports similar to previous allergic reactions Was improving on arrival, continues to improve   Reevaluation:  After the interventions noted above, I reevaluated the patient and found that they have :resolved   Social Determinants of Health:  Good family support   Disposition:  After consideration of the diagnostic results and the patients response to treatment, I feel that the patient would benefit from discharge home.  Risk Prescription drug management.           Final Clinical Impression(s) / ED Diagnoses Final diagnoses:  Acute allergic reaction, initial encounter    Rx / DC Orders ED Discharge Orders     None         Megan Huang 09/11/23 2057    Ula Prentice SAUNDERS, MD 09/12/23 0001

## 2024-01-24 LAB — HM MAMMOGRAPHY

## 2024-07-18 ENCOUNTER — Other Ambulatory Visit: Payer: Self-pay | Admitting: Obstetrics and Gynecology

## 2024-07-18 DIAGNOSIS — M8589 Other specified disorders of bone density and structure, multiple sites: Secondary | ICD-10-CM

## 2024-07-18 NOTE — Telephone Encounter (Signed)
 Med refill request:  *alendronate  (FOSAMAX ) 70 MG tablet  Start:  08/07/23 Disp:  12 tablets Refills:  3  Last B&P:   06/28/22 Next B&P:  08/11/24 Last MMG (if hormonal med):  N/A Refill authorized? Please Advise.

## 2024-08-11 ENCOUNTER — Ambulatory Visit: Payer: Medicare Other | Admitting: Obstetrics and Gynecology

## 2024-08-11 ENCOUNTER — Encounter: Payer: Self-pay | Admitting: Obstetrics and Gynecology

## 2024-08-11 VITALS — BP 126/82 | HR 73 | Ht 64.0 in | Wt 166.0 lb

## 2024-08-11 DIAGNOSIS — Z5181 Encounter for therapeutic drug level monitoring: Secondary | ICD-10-CM | POA: Diagnosis not present

## 2024-08-11 DIAGNOSIS — M8589 Other specified disorders of bone density and structure, multiple sites: Secondary | ICD-10-CM

## 2024-08-11 DIAGNOSIS — Z9189 Other specified personal risk factors, not elsewhere classified: Secondary | ICD-10-CM | POA: Diagnosis not present

## 2024-08-11 DIAGNOSIS — Z01419 Encounter for gynecological examination (general) (routine) without abnormal findings: Secondary | ICD-10-CM | POA: Diagnosis not present

## 2024-08-11 DIAGNOSIS — N393 Stress incontinence (female) (male): Secondary | ICD-10-CM

## 2024-08-11 DIAGNOSIS — Z8041 Family history of malignant neoplasm of ovary: Secondary | ICD-10-CM

## 2024-08-11 MED ORDER — ALENDRONATE SODIUM 70 MG PO TABS: ORAL_TABLET | ORAL | 3 refills | Status: DC

## 2024-08-11 NOTE — Progress Notes (Addendum)
 78 y.o. [id number] Married Caucasian female here for a breast and pelvic exam.    The patient is also followed for osteopenia and increased risk of hip fracture by FRAX.  Taking Fosamax  for osteopenia since 2023.  No problems tolerating the Fosamax .    Can leak urine if has coughing.   Forgot one of her inhalers today, and she is coughing.  Denies respiratory infection.    Labs with PCP.   [place] outside the country.   PCP: Dwight Trula SQUIBB, MD   Patient's last menstrual period was 09/04/1996.           Sexually active: Yes.    The current method of family planning is status post hysterectomy.    Menopausal hormone therapy:  n/a Exercising: Yes.    Walking  Smoker:  Former   OB History     Gravida  6   Para  2   Term  2   Preterm      AB  4   Living  2      SAB      IAB      Ectopic      Multiple      Live Births              HEALTH MAINTENANCE: Last 2 paps: 12/07/10 neg  History of abnormal Pap or positive HPV:  no Mammogram:  01/06/22 Breast Density Cat B, BIRADS Cat 2 benign  Colonoscopy:  2019 normal  Bone Density:  11/24/21  Result  osteopenia of hips and spine.  FRAX 18%/4%.     Immunization History  Administered Date(s) Administered   PFIZER(Purple Top)SARS-COV-2 Vaccination 06/08/2020      reports that she has quit smoking. She has never been exposed to tobacco smoke. She has never used smokeless tobacco. She reports current alcohol use of about 5.0 standard drinks of alcohol per week. She reports that she does not use drugs.  Past Medical History:  Diagnosis Date   Arthritis    Asthma    DUB (dysfunctional uterine bleeding)    Elevated cholesterol    Family history of adverse reaction to anesthesia    mother woke up during anesthesia and hallucinations afterwards   Family history of cancer    Family history of colon cancer    Family history of ovarian cancer    Family history of prostate cancer    Family history of uterine cancer     Fibroid    GERD (gastroesophageal reflux disease)    Headache    Hypertension    Osteopenia    Osteopenia    PONV (postoperative nausea and vomiting)    Thyroid  disease    Hyperthyroid   Von Willebrand disease (HCC)    hx of per pt    Past Surgical History:  Procedure Laterality Date   cataract surgery      CHOLECYSTECTOMY N/A 04/25/2021   Procedure: LAPAROSCOPIC CHOLECYSTECTOMY WITH INTRAOPERATIVE CHOLANGIOGRAM;  Surgeon: Eletha Boas, MD;  Location: [facility];  Service: General;  Laterality: N/A;   MOUTH SURGERY     OOPHORECTOMY  09/04/1996   RSO   TUBAL LIGATION     VAGINAL HYSTERECTOMY  09/04/1996   one ovary intact    Current Outpatient Medications  Medication Sig Dispense Refill   alendronate  (FOSAMAX ) 70 MG tablet TAKE 1TAB EVERY 7DAYS FIRST THING IN AM WITH 6OZ WATER. STAY UPRIGHT &EAT NOTHING FOR 1 HOUR. 12 tablet 0   atorvastatin (LIPITOR) 10 MG tablet Take  5 mg by mouth daily.     Calcium Carbonate-Vitamin D  (CALCIUM + D PO) Take 1 tablet by mouth in the morning and at bedtime.     cetirizine (ZYRTEC) 10 MG tablet Take 10 mg by mouth daily.     EPINEPHrine  0.3 mg/0.3 mL IJ SOAJ injection SMARTSIG:injection IM As Directed PRN     famotidine  (PEPCID ) 20 MG tablet Take 20 mg by mouth daily.     fluticasone (FLONASE) 50 MCG/ACT nasal spray Place 1 spray into both nostrils daily as needed for allergies.   11   Fluticasone Furoate (ARNUITY ELLIPTA) 100 MCG/ACT AEPB Inhale 1 puff into the lungs daily.     hydrochlorothiazide  (HYDRODIURIL ) 12.5 MG tablet Take 12.5 mg by mouth every morning.     methimazole  (TAPAZOLE ) 10 MG tablet Take 5 mg by mouth daily.  3   metoprolol  tartrate (LOPRESSOR ) 25 MG tablet Take 12.5 mg by mouth 2 (two) times daily as needed (Heart rhythem).     Multiple Vitamin (MULTIVITAMIN) tablet Take 1 tablet by mouth daily.     nitroGLYCERIN (NITROSTAT) 0.4 MG SL tablet Place 0.4 mg under the tongue every 5 (five) minutes as needed for chest pain.      Polyethyl Glycol-Propyl Glycol (SYSTANE OP) Place 1 drop into both eyes in the morning and at bedtime.     PROAIR  HFA 108 (90 Base) MCG/ACT inhaler Inhale 2 puffs into the lungs every 6 (six) hours as needed for wheezing or shortness of breath.  1   telmisartan (MICARDIS) 20 MG tablet Take 20 mg by mouth daily.     XOLAIR 150 MG/ML pen      No current facility-administered medications for this visit.    ALLERGIES: Black pepper [piper], Fish allergy, Shellfish allergy, Wasp venom, Aspirin, Ibuprofen, Moxifloxacin, Penicillins, and Wool alcohol [lanolin]  Family History  Problem Relation Age of Onset   Heart disease Mother    Hypertension Mother    Ovarian cancer Mother 69   Colon cancer Mother 30   Skin cancer Mother    Osteoporosis Mother    Uterine cancer Mother 37   Leukemia Father    Prostate cancer Father        dx 101s   Skin cancer Father    Skin cancer Sister    Prostate cancer Brother        dx 80s   Heart disease Brother    Melanoma Brother        dx 39s   Skin cancer Brother    Prostate cancer Brother        dx 5s   Skin cancer Brother    Breast cancer Maternal Aunt 17       deceased 78   Breast cancer Maternal Aunt        dx 75s   Dementia Maternal Grandmother    Lung cancer Maternal Grandfather    Uterine cancer Paternal Grandmother    Prostate cancer Paternal Grandfather    Other Daughter        Mast Cell Disorder/Condition   Hypoparathyroidism Daughter    Thyroid  disease Daughter    Other Daughter        Mast Cell Disorder/Condition   Thyroid  disease Daughter    Ovarian cancer Other    Colon cancer Other    Colon cancer Nephew 42    Review of Systems  All other systems reviewed and are negative.   PHYSICAL EXAM:  BP 126/82 (BP Location: Left Arm, Patient Position: Sitting)  Pulse 73   Ht 5' 4 (1.626 m)   Wt 166 lb (75.3 kg)   LMP 09/04/1996   SpO2 97%   BMI 28.49 kg/m     General appearance: alert, cooperative and appears stated  age Head: normocephalic, without obvious abnormality, atraumatic Neck: no adenopathy, supple, symmetrical, trachea midline and thyroid  normal to inspection and palpation Lungs: clear to auscultation bilaterally Breasts: normal appearance, no masses or tenderness, No nipple retraction or dimpling, No nipple discharge or bleeding, No axillary adenopathy Heart: regular rate and rhythm Abdomen: soft, non-tender; no masses, no organomegaly Extremities: extremities normal, atraumatic, no cyanosis or edema Skin: skin color, texture, turgor normal. No rashes or lesions Lymph nodes: cervical, supraclavicular, and axillary nodes normal. Neurologic: grossly normal  Pelvic: External genitalia:  multiple sebaceous cysts of the vulva.              No abnormal inguinal nodes palpated.              Urethra:  normal appearing urethra with no masses, tenderness or lesions              Bartholins and Skenes: normal                 Vagina: normal appearing vagina with normal color and discharge, no lesions              Cervix: absent              Pap taken: no Bimanual Exam:  Uterus:  absent              Adnexa: no mass, fullness, tenderness              Rectal exam: yes.  Confirms.              Anus:  normal sphincter tone, perianal sebaceous cysts.  Chaperone was present for exam:  Kari HERO, CMA  ASSESSMENT: Encounter for breast and pelvic exam.  Status post total vaginal hysterectomy with RSO for fibroids in 1998.  Left ovary remains. Atypical cells noted per patient.  Osteopenia of multiple sites, increased risk of hip fracture by FRAX. Encounter for medication monitoring.  Family history of ovarian cancer, colon cancer, and other cancers.  Mother was BRCA2 positive.  Personal negative genetic testing.   Hx hiatal hernia and esophageal stricture.  Mild stress incontinence.  Sebaceous cysts.   PLAN: Mammogram screening discussed.  Get a copy of mammogram from 2024 and 2025 from [facility].  Self  breast awareness reviewed. Pap and HRV collected:  no.  Not indicated.  Guidelines for Calcium, Vitamin D , regular exercise program including cardiovascular and weight bearing exercise. Medication refills:  Fosamax  70 mg po weekly . #12, RF 3.   We discussed potential drug holiday after 5 years of treatment.   BMD ordered for [facility].  Will get a copy of her labs from her PCP for this year.   Return for pelvic ultrasound.    Kegel's. Follow up:  yearly and prn.     Additional counseling given.  yes. 30 min  total time was spent for this patient encounter, including preparation, face-to-face counseling with the patient, coordination of care, and documentation of the encounter in addition to doing the breast and pelvic exam.

## 2024-08-11 NOTE — Patient Instructions (Addendum)
 Kegel Exercises  Kegel exercises can help strengthen your pelvic floor muscles. The pelvic floor is a group of muscles that support your rectum, small intestine, and bladder. In females, pelvic floor muscles also help support the uterus. These muscles help you control the flow of urine and stool (feces). Kegel exercises are painless and simple. They do not require any equipment. Your provider may suggest Kegel exercises to: Improve bladder and bowel control. Improve sexual response. Improve weak pelvic floor muscles after surgery to remove the uterus (hysterectomy) or after pregnancy, in females. Improve weak pelvic floor muscles after prostate gland removal or surgery, in males. Kegel exercises involve squeezing your pelvic floor muscles. These are the same muscles you squeeze when you try to stop the flow of urine or keep from passing gas. The exercises can be done while sitting, standing, or lying down, but it is best to vary your position. Ask your health care provider which exercises are safe for you. Do exercises exactly as told by your health care provider and adjust them as directed. Do not begin these exercises until told by your health care provider. Exercises How to do Kegel exercises: Squeeze your pelvic floor muscles tight. You should feel a tight lift in your rectal area. If you are a female, you should also feel a tightness in your vaginal area. Keep your stomach, buttocks, and legs relaxed. Hold the muscles tight for up to 10 seconds. Breathe normally. Relax your muscles for up to 10 seconds. Repeat as told by your health care provider. Repeat this exercise daily as told by your health care provider. Continue to do this exercise for at least 4-6 weeks, or for as long as told by your health care provider. You may be referred to a physical therapist who can help you learn more about how to do Kegel exercises. Depending on your condition, your health care provider may  recommend: Varying how long you squeeze your muscles. Doing several sets of exercises every day. Doing exercises for several weeks. Making Kegel exercises a part of your regular exercise routine. This information is not intended to replace advice given to you by your health care provider. Make sure you discuss any questions you have with your health care provider. Document Revised: 12/30/2020 Document Reviewed: 12/30/2020 Elsevier Patient Education  2024 [employer]  EXERCISE AND DIET:  We recommended that you start or continue a regular exercise program for good health. Regular exercise means any activity that makes your heart beat faster and makes you sweat.  We recommend exercising at least 30 minutes per day at least 3 days a week, preferably 4 or 5.  We also recommend a diet low in fat and sugar.  Inactivity, poor dietary choices and obesity can cause diabetes, heart attack, stroke, and kidney damage, among others.    ALCOHOL AND SMOKING:  Women should limit their alcohol intake to no more than 7 drinks/beers/glasses of wine (combined, not each!) per week. Moderation of alcohol intake to this level decreases your risk of breast cancer and liver damage. And of course, no recreational drugs are part of a healthy lifestyle.  And absolutely no smoking or even second hand smoke. Most people know smoking can cause heart and lung diseases, but did you know it also contributes to weakening of your bones? Aging of your skin?  Yellowing of your teeth and nails?  CALCIUM AND VITAMIN D:  Adequate intake of calcium and Vitamin D are recommended.  The recommendations for exact amounts of  these supplements seem to change often, but generally speaking 600 mg of calcium (either carbonate or citrate) and 800 units of Vitamin D per day seems prudent. Certain women may benefit from higher intake of Vitamin D.  If you are among these women, your doctor will have told you during your visit.    PAP SMEARS:  Pap  smears, to check for cervical cancer or precancers,  have traditionally been done yearly, although recent scientific advances have shown that most women can have pap smears less often.  However, every woman still should have a physical exam from her gynecologist every year. It will include a breast check, inspection of the vulva and vagina to check for abnormal growths or skin changes, a visual exam of the cervix, and then an exam to evaluate the size and shape of the uterus and ovaries.  And after 77 years of age, a rectal exam is indicated to check for rectal cancers. We will also provide age appropriate advice regarding health maintenance, like when you should have certain vaccines, screening for sexually transmitted diseases, bone density testing, colonoscopy, mammograms, etc.   MAMMOGRAMS:  All women over 7 years old should have a yearly mammogram. Many facilities now offer a 3D mammogram, which may cost around $50 extra out of pocket. If possible,  we recommend you accept the option to have the 3D mammogram performed.  It both reduces the number of women who will be called back for extra views which then turn out to be normal, and it is better than the routine mammogram at detecting truly abnormal areas.    COLONOSCOPY:  Colonoscopy to screen for colon cancer is recommended for all women at age 48.  We know, you hate the idea of the prep.  We agree, BUT, having colon cancer and not knowing it is worse!!  Colon cancer so often starts as a polyp that can be seen and removed at colonscopy, which can quite literally save your life!  And if your first colonoscopy is normal and you have no family history of colon cancer, most women don't have to have it again for 10 years.  Once every ten years, you can do something that may end up saving your life, right?  We will be happy to help you get it scheduled when you are ready.  Be sure to check your insurance coverage so you understand how much it will cost.  It  may be covered as a preventative service at no cost, but you should check your particular policy.

## 2024-08-12 ENCOUNTER — Ambulatory Visit: Payer: Self-pay | Admitting: Obstetrics and Gynecology

## 2024-08-19 ENCOUNTER — Other Ambulatory Visit: Payer: PRIVATE HEALTH INSURANCE

## 2024-08-20 ENCOUNTER — Ambulatory Visit (INDEPENDENT_AMBULATORY_CARE_PROVIDER_SITE_OTHER)

## 2024-08-20 DIAGNOSIS — Z8041 Family history of malignant neoplasm of ovary: Secondary | ICD-10-CM | POA: Diagnosis not present

## 2024-08-22 ENCOUNTER — Telehealth: Payer: Self-pay | Admitting: Obstetrics and Gynecology

## 2024-08-22 NOTE — Telephone Encounter (Signed)
 Please contact patient regarding her preferred location for a bone density.   I believe we have already faxed an order to [facility], which is where she is doing her mammograms.   If she would prefer [facility], I can place an order there.   Timing wise, she will likely get a bone density done sooner at [facility].

## 2024-08-25 NOTE — Telephone Encounter (Signed)
 Confirmed with [facility], no BMD order on file.  Order printed. Copy to Dr. Nikki to sign to be faxed.

## 2024-08-25 NOTE — Telephone Encounter (Signed)
 Spoke with patient, advised per Dr. Nikki. Patient states she will go to [facility] for BMD. Advised patient I will reach out to [facility] to confirm order received and have [facility] reach out to you directly to schedule. Patient verbalizes understanding.   Message to [facility] patient care coordinator to confirm if order on file.

## 2024-08-25 NOTE — Telephone Encounter (Signed)
 Order signed and given to CMA to fax.

## 2024-08-29 ENCOUNTER — Ambulatory Visit: Payer: Self-pay | Admitting: Obstetrics and Gynecology

## 2024-09-11 ENCOUNTER — Ambulatory Visit: Payer: PRIVATE HEALTH INSURANCE | Admitting: Obstetrics and Gynecology

## 2024-09-18 LAB — HM DEXA SCAN

## 2024-09-25 ENCOUNTER — Ambulatory Visit: Payer: Self-pay | Admitting: Obstetrics and Gynecology

## 2024-10-05 ENCOUNTER — Other Ambulatory Visit: Payer: Self-pay | Admitting: Obstetrics and Gynecology

## 2024-10-05 DIAGNOSIS — M8589 Other specified disorders of bone density and structure, multiple sites: Secondary | ICD-10-CM

## 2025-08-13 ENCOUNTER — Ambulatory Visit: Payer: PRIVATE HEALTH INSURANCE | Admitting: Obstetrics and Gynecology
